# Patient Record
Sex: Female | Born: 1960 | Race: White | Hispanic: No | Smoking: Former smoker
Health system: Southern US, Community
[De-identification: ages and names within clinical notes are randomized; demographics above are authoritative.]

## PROBLEM LIST (undated history)

## (undated) DIAGNOSIS — Z8042 Family history of malignant neoplasm of prostate: Secondary | ICD-10-CM

## (undated) DIAGNOSIS — E0500 Thyrotoxicosis with diffuse goiter without thyrotoxic crisis or storm: Secondary | ICD-10-CM

## (undated) DIAGNOSIS — E0590 Thyrotoxicosis, unspecified without thyrotoxic crisis or storm: Secondary | ICD-10-CM

## (undated) DIAGNOSIS — R3129 Other microscopic hematuria: Secondary | ICD-10-CM

## (undated) DIAGNOSIS — E039 Hypothyroidism, unspecified: Secondary | ICD-10-CM

## (undated) DIAGNOSIS — Z803 Family history of malignant neoplasm of breast: Secondary | ICD-10-CM

## (undated) DIAGNOSIS — D126 Benign neoplasm of colon, unspecified: Secondary | ICD-10-CM

## (undated) DIAGNOSIS — Z853 Personal history of malignant neoplasm of breast: Secondary | ICD-10-CM

## (undated) DIAGNOSIS — J309 Allergic rhinitis, unspecified: Secondary | ICD-10-CM

## (undated) DIAGNOSIS — C801 Malignant (primary) neoplasm, unspecified: Secondary | ICD-10-CM

## (undated) DIAGNOSIS — R946 Abnormal results of thyroid function studies: Secondary | ICD-10-CM

## (undated) DIAGNOSIS — R609 Edema, unspecified: Secondary | ICD-10-CM

## (undated) DIAGNOSIS — M1990 Unspecified osteoarthritis, unspecified site: Secondary | ICD-10-CM

## (undated) HISTORY — DX: Family history of malignant neoplasm of breast: Z80.3

## (undated) HISTORY — DX: Malignant (primary) neoplasm, unspecified: C80.1

## (undated) HISTORY — DX: Personal history of malignant neoplasm of breast: Z85.3

## (undated) HISTORY — DX: Other microscopic hematuria: R31.29

## (undated) HISTORY — DX: Abnormal results of thyroid function studies: R94.6

## (undated) HISTORY — PX: CHOLECYSTECTOMY: SHX55

## (undated) HISTORY — DX: Thyrotoxicosis, unspecified without thyrotoxic crisis or storm: E05.90

## (undated) HISTORY — DX: Edema, unspecified: R60.9

## (undated) HISTORY — DX: Benign neoplasm of colon, unspecified: D12.6

## (undated) HISTORY — PX: BREAST SURGERY: SHX581

## (undated) HISTORY — DX: Family history of malignant neoplasm of prostate: Z80.42

## (undated) HISTORY — DX: Allergic rhinitis, unspecified: J30.9

---

## 1994-07-24 DIAGNOSIS — C801 Malignant (primary) neoplasm, unspecified: Secondary | ICD-10-CM

## 1994-07-24 HISTORY — DX: Malignant (primary) neoplasm, unspecified: C80.1

## 1998-05-11 ENCOUNTER — Encounter: Admission: RE | Admit: 1998-05-11 | Discharge: 1998-08-09 | Payer: Self-pay

## 1998-08-26 ENCOUNTER — Other Ambulatory Visit: Admission: RE | Admit: 1998-08-26 | Discharge: 1998-08-26 | Payer: Self-pay | Admitting: Obstetrics and Gynecology

## 1998-11-25 ENCOUNTER — Ambulatory Visit (HOSPITAL_COMMUNITY): Admission: RE | Admit: 1998-11-25 | Discharge: 1998-11-25 | Payer: Self-pay | Admitting: Surgery

## 1998-11-25 ENCOUNTER — Encounter: Payer: Self-pay | Admitting: Surgery

## 1999-12-30 ENCOUNTER — Other Ambulatory Visit: Admission: RE | Admit: 1999-12-30 | Discharge: 1999-12-30 | Payer: Self-pay | Admitting: Obstetrics and Gynecology

## 2001-01-16 ENCOUNTER — Other Ambulatory Visit: Admission: RE | Admit: 2001-01-16 | Discharge: 2001-01-16 | Payer: Self-pay | Admitting: Obstetrics and Gynecology

## 2001-07-19 ENCOUNTER — Other Ambulatory Visit: Admission: RE | Admit: 2001-07-19 | Discharge: 2001-07-19 | Payer: Self-pay | Admitting: Obstetrics and Gynecology

## 2001-11-18 ENCOUNTER — Encounter: Payer: Self-pay | Admitting: Specialist

## 2001-11-18 ENCOUNTER — Ambulatory Visit (HOSPITAL_COMMUNITY): Admission: RE | Admit: 2001-11-18 | Discharge: 2001-11-18 | Payer: Self-pay | Admitting: Specialist

## 2003-01-07 ENCOUNTER — Other Ambulatory Visit: Admission: RE | Admit: 2003-01-07 | Discharge: 2003-01-07 | Payer: Self-pay | Admitting: Obstetrics and Gynecology

## 2003-05-07 ENCOUNTER — Encounter: Payer: Self-pay | Admitting: Gastroenterology

## 2003-05-07 ENCOUNTER — Encounter: Admission: RE | Admit: 2003-05-07 | Discharge: 2003-05-07 | Payer: Self-pay | Admitting: Gastroenterology

## 2003-05-25 ENCOUNTER — Encounter (INDEPENDENT_AMBULATORY_CARE_PROVIDER_SITE_OTHER): Payer: Self-pay | Admitting: Specialist

## 2003-05-25 ENCOUNTER — Ambulatory Visit (HOSPITAL_COMMUNITY): Admission: RE | Admit: 2003-05-25 | Discharge: 2003-05-25 | Payer: Self-pay | Admitting: Gastroenterology

## 2004-01-27 ENCOUNTER — Other Ambulatory Visit: Admission: RE | Admit: 2004-01-27 | Discharge: 2004-01-27 | Payer: Self-pay | Admitting: Obstetrics and Gynecology

## 2005-06-08 ENCOUNTER — Other Ambulatory Visit: Admission: RE | Admit: 2005-06-08 | Discharge: 2005-06-08 | Payer: Self-pay | Admitting: Obstetrics and Gynecology

## 2008-06-14 LAB — HM COLONOSCOPY

## 2008-09-21 LAB — CONVERTED CEMR LAB: Pap Smear: NORMAL

## 2009-09-14 LAB — HM MAMMOGRAPHY: HM Mammogram: NEGATIVE

## 2009-10-08 ENCOUNTER — Ambulatory Visit: Payer: Self-pay | Admitting: Family Medicine

## 2009-10-08 DIAGNOSIS — R609 Edema, unspecified: Secondary | ICD-10-CM | POA: Insufficient documentation

## 2009-10-08 DIAGNOSIS — J309 Allergic rhinitis, unspecified: Secondary | ICD-10-CM

## 2009-10-08 DIAGNOSIS — Z853 Personal history of malignant neoplasm of breast: Secondary | ICD-10-CM

## 2009-10-08 DIAGNOSIS — D126 Benign neoplasm of colon, unspecified: Secondary | ICD-10-CM | POA: Insufficient documentation

## 2009-10-08 HISTORY — DX: Benign neoplasm of colon, unspecified: D12.6

## 2009-10-08 HISTORY — DX: Allergic rhinitis, unspecified: J30.9

## 2009-10-08 HISTORY — DX: Edema, unspecified: R60.9

## 2009-10-08 HISTORY — DX: Personal history of malignant neoplasm of breast: Z85.3

## 2009-10-25 ENCOUNTER — Ambulatory Visit: Payer: Self-pay | Admitting: Family Medicine

## 2009-10-25 LAB — CONVERTED CEMR LAB
ALT: 30 units/L (ref 0–35)
AST: 28 units/L (ref 0–37)
Albumin: 3.3 g/dL — ABNORMAL LOW (ref 3.5–5.2)
Alkaline Phosphatase: 37 units/L — ABNORMAL LOW (ref 39–117)
BUN: 11 mg/dL (ref 6–23)
Basophils Absolute: 0 10*3/uL (ref 0.0–0.1)
Basophils Relative: 0.5 % (ref 0.0–3.0)
Bilirubin Urine: NEGATIVE
Bilirubin, Direct: 0.1 mg/dL (ref 0.0–0.3)
CO2: 26 meq/L (ref 19–32)
Calcium: 9 mg/dL (ref 8.4–10.5)
Chloride: 109 meq/L (ref 96–112)
Cholesterol: 100 mg/dL (ref 0–200)
Creatinine, Ser: 0.5 mg/dL (ref 0.4–1.2)
Eosinophils Absolute: 0.1 10*3/uL (ref 0.0–0.7)
Eosinophils Relative: 2.7 % (ref 0.0–5.0)
GFR calc non Af Amer: 140.41 mL/min (ref >60)
Glucose, Bld: 87 mg/dL (ref 70–99)
Glucose, Urine, Semiquant: NEGATIVE
HCT: 35.8 % — ABNORMAL LOW (ref 36.0–46.0)
HDL: 39.2 mg/dL (ref >39.00)
Hemoglobin: 12.5 g/dL (ref 12.0–15.0)
Ketones, urine, test strip: NEGATIVE
LDL Cholesterol: 51 mg/dL (ref 0–99)
Lymphocytes Relative: 45 % (ref 12.0–46.0)
Lymphs Abs: 2.1 10*3/uL (ref 0.7–4.0)
MCHC: 34.9 g/dL (ref 30.0–36.0)
MCV: 91.8 fL (ref 78.0–100.0)
Monocytes Absolute: 0.4 10*3/uL (ref 0.1–1.0)
Monocytes Relative: 7.7 % (ref 3.0–12.0)
Neutro Abs: 2.1 10*3/uL (ref 1.4–7.7)
Neutrophils Relative %: 44.1 % (ref 43.0–77.0)
Nitrite: NEGATIVE
Platelets: 203 10*3/uL (ref 150.0–400.0)
Potassium: 3.9 meq/L (ref 3.5–5.1)
Protein, U semiquant: NEGATIVE
RBC: 3.9 M/uL (ref 3.87–5.11)
RDW: 11.8 % (ref 11.5–14.6)
Sodium: 143 meq/L (ref 135–145)
Specific Gravity, Urine: 1.015
TSH: 0.01 microintl units/mL — ABNORMAL LOW (ref 0.35–5.50)
Total Bilirubin: 1 mg/dL (ref 0.3–1.2)
Total CHOL/HDL Ratio: 3
Total Protein: 5.6 g/dL — ABNORMAL LOW (ref 6.0–8.3)
Triglycerides: 51 mg/dL (ref 0.0–149.0)
Urobilinogen, UA: 0.2
VLDL: 10.2 mg/dL (ref 0.0–40.0)
WBC: 4.7 10*3/uL (ref 4.5–10.5)
pH: 5

## 2009-11-01 ENCOUNTER — Ambulatory Visit: Payer: Self-pay | Admitting: Family Medicine

## 2009-11-01 DIAGNOSIS — R3129 Other microscopic hematuria: Secondary | ICD-10-CM

## 2009-11-01 DIAGNOSIS — R946 Abnormal results of thyroid function studies: Secondary | ICD-10-CM | POA: Insufficient documentation

## 2009-11-01 HISTORY — DX: Other microscopic hematuria: R31.29

## 2009-11-01 HISTORY — DX: Abnormal results of thyroid function studies: R94.6

## 2009-11-01 LAB — CONVERTED CEMR LAB
Bilirubin Urine: NEGATIVE
Glucose, Urine, Semiquant: NEGATIVE
Ketones, urine, test strip: NEGATIVE
Nitrite: NEGATIVE
Protein, U semiquant: NEGATIVE
Specific Gravity, Urine: <1.005
Urobilinogen, UA: 0.2
pH: 7.5

## 2009-11-03 DIAGNOSIS — E0590 Thyrotoxicosis, unspecified without thyrotoxic crisis or storm: Secondary | ICD-10-CM

## 2009-11-03 HISTORY — DX: Thyrotoxicosis, unspecified without thyrotoxic crisis or storm: E05.90

## 2009-11-03 LAB — CONVERTED CEMR LAB
Free T4: 3.2 ng/dL — ABNORMAL HIGH (ref 0.6–1.6)
TSH: 0.01 microintl units/mL — ABNORMAL LOW (ref 0.35–5.50)

## 2009-11-08 ENCOUNTER — Telehealth: Payer: Self-pay | Admitting: Family Medicine

## 2009-11-15 ENCOUNTER — Encounter (HOSPITAL_COMMUNITY): Admission: RE | Admit: 2009-11-15 | Discharge: 2010-02-13 | Payer: Self-pay | Admitting: Family Medicine

## 2009-11-30 ENCOUNTER — Encounter: Payer: Self-pay | Admitting: Family Medicine

## 2009-12-29 ENCOUNTER — Telehealth: Payer: Self-pay | Admitting: Family Medicine

## 2010-01-25 ENCOUNTER — Encounter (HOSPITAL_COMMUNITY): Admission: RE | Admit: 2010-01-25 | Discharge: 2010-03-30 | Payer: Self-pay | Admitting: Internal Medicine

## 2010-08-14 ENCOUNTER — Encounter: Payer: Self-pay | Admitting: Family Medicine

## 2010-08-24 HISTORY — PX: OTHER SURGICAL HISTORY: SHX169

## 2010-08-25 NOTE — Progress Notes (Signed)
Summary: heart rate 105 & very nervous  Phone Note Call from Patient Call back at [phone]   Summary of Call: Heart rate has gone up to 105, normal 68-72.  Very nervous & hard to concentrate.   Want nuclear med thyroid scan at [facility] moved up if possble.  Scheduled Apr 26 at 12:00 Initial call taken by: Rudy Jew, RN,  November 08, 2009 12:37 PM  Follow-up for Phone Call        Start Toprol XL 25 mg by mouth once daily (disp #30 with 3 refills) and see if we can move up  her nuclear thyroid scan.  She requests that her Rx be sent electronically to her pharmacy. Follow-up by: Evelena Peat MD,  November 08, 2009 1:21 PM  Additional Follow-up for Phone Call Additional follow up Details #1::        Phone Call Completed Additional Follow-up by: Rudy Jew, RN,  November 08, 2009 1:30 PM    New/Updated Medications: TOPROL XL 25 MG XR24H-TAB (METOPROLOL SUCCINATE) by mouth once daily Prescriptions: TOPROL XL 25 MG XR24H-TAB (METOPROLOL SUCCINATE) by mouth once daily  #30 x 3   Entered by:   Rudy Jew, RN   Authorized by:   Evelena Peat MD   Signed by:   Rudy Jew, RN on 11/08/2009   Method used:   [facility] to        [facility] [phone]* (retail)       [address]       [place], [place]  [address]       Ph: [phone] or [phone]       Fax: [phone]   RxID:   [id number]

## 2010-08-25 NOTE — Assessment & Plan Note (Signed)
Summary: est //ccm/pt rsc/cjr   Vital Signs:  Patient profile:   50 year old female Menstrual status:  regular LMP:     09/27/2009 Height:      67 inches Weight:      170 pounds BMI:     26.72 Temp:     99.3 degrees F oral Pulse rate:   88 / minute Pulse rhythm:   regular Resp:     12 per minute BP sitting:   110 / 64  (left arm) Cuff size:   regular  Vitals Entered By: Sid Falcon LPN (October 08, 2009 10:21 AM) CC: New to establish, swelling in ankles LMP (date): 09/27/2009     Menstrual Status regular Enter LMP: 09/27/2009 Last PAP Result normal   History of Present Illness: New patient to establish care.  Past history of breast cancer back in 1996. Right breast lumpectomy with radiation therapy. Previous cholecystectomy 1993. Allergy to sulfa. Takes Klonopin as needed for anxiety. History of benign colon polyps.  Family history significant for alcoholism Brother, sister, and father. Father with hypertension history.  Patient is divorced. Works full-time. One daughter. Smokes one half to one pack cigarettes per day. Rare alcohol use.  Colonoscopy 2010. Last tetanus greater than 10 years ago. Regular GYN followup and gets yearly mammograms.  Acute issue is increased ankle edema past several weeks. Occasional pitting. Worse late in the day. No dyspnea.  No regular NSAID use.  Patient has chronic sinus congestion and postnasal drip symptoms. Previously on Nasonex which worked well. Currently not using antihistamine. Alavert worked minimally in the past.  Press photographer & Management  Alcohol-Tobacco     Smoking Status: current     Smoking Cessation Counseling: yes     Smoke Cessation Stage: ready     Packs/Day: 1.0     Year Started: 1984  Caffeine-Diet-Exercise     Does Patient Exercise: no  Allergies (verified): 1)  Codeine Sulfate (Codeine Sulfate)  Past History:  Family History: Last updated: 10/08/2009 Family History of  Alcoholism/Addiction, sister, brother Family History of Arthritis, mother Family History Lung cancer Father, alcohol, drug problem, hypertension, PTSD  Social History: Last updated: 10/08/2009 Occupation:  RN Divorced Current Smoker Alcohol use-yes Regular exercise-no  Risk Factors: Exercise: no (10/08/2009)  Risk Factors: Smoking Status: current (10/08/2009) Packs/Day: 1.0 (10/08/2009)  Past Medical History: Breast cancer Right breast 1996 Colonic polyps  Past Surgical History: Cholecystectomy 1993 BRCA Right lumpectomy with axillary node disection 6 weeks radiation R breast  Family History: Family History of Alcoholism/Addiction, sister, brother Family History of Arthritis, mother Family History Lung cancer Father, alcohol, drug problem, hypertension, PTSD  Social History: Occupation:  Charity fundraiser Divorced Current Smoker Alcohol use-yes Regular exercise-no Smoking Status:  current Packs/Day:  1.0 Occupation:  employed Does Patient Exercise:  no  Review of Systems       The patient complains of peripheral edema.  The patient denies anorexia, fever, weight loss, weight gain, chest pain, syncope, dyspnea on exertion, prolonged cough, headaches, hemoptysis, abdominal pain, melena, hematochezia, severe indigestion/heartburn, muscle weakness, and depression.         frequent palpitations at rest. No syncope or presyncopal symptoms  Physical Exam  General:  Well-developed,well-nourished,in no acute distress; alert,appropriate and cooperative throughout examination Eyes:  pupils equal, pupils round, and pupils reactive to light.   Ears:  scarring left TM and no acute change Mouth:  Oral mucosa and oropharynx without lesions or exudates.  Teeth in good repair. Neck:  No deformities, masses, or  tenderness noted. Lungs:  Normal respiratory effort, chest expands symmetrically. Lungs are clear to auscultation, no crackles or wheezes. Heart:  normal rate, regular rhythm, and no  murmur.   Extremities:  no pitting edema at this time. 2 plus dorsalis pedis pulses bilaterally Neurologic:  alert & oriented X3.   Psych:  good eye contact and not depressed appearing.     Impression & Recommendations:  Problem # 1:  EDEMA (ICD-782.3) Assessment New pt wil schedule CPE and get screening labs then.  Problem # 2:  ALLERGIC RHINITIS (ICD-477.9) Assessment: Deteriorated get back on Nasonex Her updated medication list for this problem includes:    Nasonex 50 Mcg/act Susp (Mometasone furoate) .Marland Kitchen... 2 sprays per nostril once daily  Problem # 3:  NEOPLASM, MALIGNANT, BREAST, HX OF (ICD-V10.3) Assessment: Unchanged  Problem # 4:  Preventive Health Care (ICD-V70.0) patient encouraged to schedule complete physical examination.  pt needs tetanus booster and this is given today.  Problem # 5:  COLONIC POLYPS (ICD-211.3) Assessment: Comment Only  Complete Medication List: 1)  Nasonex 50 Mcg/act Susp (Mometasone furoate) .... 2 sprays per nostril once daily  Other Orders: Tdap => 77yrs IM ([address]) Admin 1st Vaccine (60454)  Patient Instructions: 1)  Schedule complete physical examination 2)  Stop smoking tips: Choose a quit date. Cut down before the quit date. Decide what you will do as a substitute when you feel the urge to smoke(gum, toothpick, exercise).  3)  It is important that you exercise reguarly at least 20 minutes 5 times a week. If you develop chest pain, have severe difficulty breathing, or feel very tired, stop exercising immediately and seek medical attention.  Prescriptions: NASONEX 50 MCG/ACT SUSP (MOMETASONE FUROATE) 2 sprays per nostril once daily  #1 x 11   Entered and Authorized by:   Evelena Peat MD   Signed by:   Evelena Peat MD on 10/08/2009   Method used:   [facility] to        [facility] [phone][facility] (retail)       [address]       [place], [place]  [address]       Ph: [phone] or [phone]       Fax: [phone]   RxID:    [id number]   Preventive Care Screening  Mammogram:    Date:  08/24/2009    Results:  normal   Pap Smear:    Date:  09/21/2008    Results:  normal     Immunizations Administered:  Tetanus Vaccine:    Vaccine Type: Tdap    Site: left deltoid    Mfr: GlaxoSmithKline    Dose: 0.5 ml    Route: IM    Given by: Sid Falcon LPN    Exp. Date: 10/15/2011    Lot #: [id number]

## 2010-08-25 NOTE — Assessment & Plan Note (Signed)
Summary: cpx/no pap./njr   Vital Signs:  Patient profile:   50 year old female Menstrual status:  regular Height:      67 inches Weight:      168 pounds Temp:     98.7 degrees F oral Pulse rate:   80 / minute Pulse rhythm:   regular Resp:     12 per minute BP sitting:   100 / 60  (right arm) Cuff size:   regular  Vitals Entered By: Sid Falcon LPN (November 01, 2009 10:28 AM) CC: CPX, labs done   History of Present Illness: Patient for complete physical examination. Refer to previous notes for details regarding family history, past medical history, and social history.  Patient gets her gynecologic checkups locally. Colonoscopy and tetanus up to date. She plans to quit smoking soon. No regular exercise.  Allergies: 1)  Ferrous Sulfate (Ferrous Sulfate)  Past History:  Past Medical History: Last updated: 10/08/2009 Breast cancer Right breast 1996 Colonic polyps  Past Surgical History: Last updated: 10/08/2009 Cholecystectomy 1993 BRCA Right lumpectomy with axillary node disection 6 weeks radiation R breast  Family History: Last updated: 10/08/2009 Family History of Alcoholism/Addiction, sister, brother Family History of Arthritis, mother Family History Lung cancer Father, alcohol, drug problem, hypertension, PTSD  Social History: Last updated: 10/08/2009 Occupation:  RN Divorced Current Smoker Alcohol use-yes Regular exercise-no  Risk Factors: Exercise: no (10/08/2009)  Risk Factors: Smoking Status: current (10/08/2009) Packs/Day: 1.0 (10/08/2009)  Review of Systems  The patient denies anorexia, fever, weight loss, weight gain, vision loss, decreased hearing, hoarseness, chest pain, syncope, dyspnea on exertion, peripheral edema, prolonged cough, headaches, hemoptysis, abdominal pain, melena, hematochezia, severe indigestion/heartburn, hematuria, incontinence, genital sores, muscle weakness, suspicious skin lesions, transient blindness, difficulty  walking, depression, unusual weight change, abnormal bleeding, enlarged lymph nodes, and breast masses.         patient has occ palpitations. No dizziness or chest pains.  Physical Exam  General:  Well-developed,well-nourished,in no acute distress; alert,appropriate and cooperative throughout examination Head:  Normocephalic and atraumatic without obvious abnormalities. No apparent alopecia or balding. Eyes:  No corneal or conjunctival inflammation noted. EOMI. Perrla. Funduscopic exam benign, without hemorrhages, exudates or papilledema. Vision grossly normal. Ears:  External ear exam shows no significant lesions or deformities.  Otoscopic examination reveals clear canals, tympanic membranes are intact bilaterally without bulging, retraction, inflammation or discharge. Hearing is grossly normal bilaterally. Mouth:  Oral mucosa and oropharynx without lesions or exudates.  Teeth in good repair. Neck:  No deformities, masses, or tenderness noted. Breasts:  per GYN Lungs:  Normal respiratory effort, chest expands symmetrically. Lungs are clear to auscultation, no crackles or wheezes. Heart:  Normal rate and regular rhythm. S1 and S2 normal without gallop, murmur, click, rub or other extra sounds. Abdomen:  Bowel sounds positive,abdomen soft and non-tender without masses, organomegaly or hernias noted. Genitalia:  GYN Msk:  No deformity or scoliosis noted of thoracic or lumbar spine.   Extremities:  No clubbing, cyanosis, edema, or deformity noted with normal full range of motion of all joints.   Neurologic:  alert & oriented X3, cranial nerves II-XII intact, and strength normal in all extremities.   Skin:  Intact without suspicious lesions or rashes Cervical Nodes:  No lymphadenopathy noted Psych:  normally interactive, good eye contact, not anxious appearing, and not depressed appearing.     Impression & Recommendations:  Problem # 1:  Preventive Health Care (ICD-V70.0) smoking cessation  discussed. Regular exercise. Labs reviewed. Significant for low TSH.  No overt symptoms of hyperthyroidism. Repeat TSH low T4 and T3  Problem # 2:  THYROID FUNCTION TEST, ABNORMAL (ICD-794.5) low TSH by labs.  other than palpitations no other sxs of hyperthyroid-no weight loss, diarrhea, tremor,etc  Will repeat TSH and add T4. Orders: TLB-TSH (Thyroid Stimulating Hormone) (84443-TSH) TLB-T4 (Thyrox), Free [id number]) Venipuncture (33295)  Problem # 3:  MICROSCOPIC HEMATURIA (ICD-599.72) hematuria 2 + on recent UA and today only trace.   Repeat again in 1-2 weeks to make sure this has cleared. Orders: UA Dipstick w/o Micro (manual) (18841) Venipuncture (66063)  Complete Medication List: 1)  Nasonex 50 Mcg/act Susp (Mometasone furoate) .... 2 sprays per nostril once daily 2)  Klonopin 1 Mg Tabs (Clonazepam) .... 1/2 as needed  Patient Instructions: 1)  Stop smoking tips: Choose a quit date. Cut down before the quit date. Decide what you will do as a substitute when you feel the urge to smoke(gum, toothpick, exercise).  2)  It is important that you exercise reguarly at least 20 minutes 5 times a week. If you develop chest pain, have severe difficulty breathing, or feel very tired, stop exercising immediately and seek medical attention.   Preventive Care Screening  Colonoscopy:    Date:  05/24/2008    Results:  Adenomatous Polyp   Laboratory Results   Urine Tests    Routine Urinalysis   Color: yellow Appearance: Clear Glucose: negative   (Normal Range: Negative) Bilirubin: negative   (Normal Range: Negative) Ketone: negative   (Normal Range: Negative) Spec. Gravity: <1.005   (Normal Range: 1.003-1.035) Blood: trace-intact   (Normal Range: Negative) pH: 7.5   (Normal Range: 5.0-8.0) Protein: negative   (Normal Range: Negative) Urobilinogen: 0.2   (Normal Range: 0-1) Nitrite: negative   (Normal Range: Negative) Leukocyte Esterace: moderate   (Normal Range: Negative)      Comments: Sid Falcon LPN  November 01, 2009 12:10 PM

## 2010-08-25 NOTE — Consult Note (Signed)
[facility]   Imported By: Alicia Mcconnell 12/08/2009 09:00:23  _____________________________________________________________________  External Attachment:    Type:   Image     Comment:   External Document

## 2010-08-25 NOTE — Progress Notes (Signed)
Summary: REFILL REQUEST Metoprolol  Phone Note Refill Request Message from:  Patient on December 29, 2009 8:33 AM  Refills Requested: Medication #1:  TOPROL XL 25 MG XR24H-TAB by mouth once daily.   Notes: 90[facility]DAY SUPPLY... [facility] [place]    Initial call taken by: Debbra Riding,  December 29, 2009 8:34 AM    Prescriptions: TOPROL XL 25 MG XR24H-TAB (METOPROLOL SUCCINATE) by mouth once daily  #90 x 3   Entered by:   Sid Falcon LPN   Authorized by:   Evelena Peat MD   Signed by:   Sid Falcon LPN on 62/95/2841   Method used:   [facility]ron[facility] to        [facility] [phone]* (retail)       [address]       [place], [place]  [address]       Ph: [phone] or [phone]       Fax: [phone]   RxID:   [id number]

## 2010-09-02 ENCOUNTER — Telehealth: Payer: Self-pay | Admitting: Family Medicine

## 2010-09-02 NOTE — Telephone Encounter (Signed)
Would like a call back. 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

## 2010-09-02 NOTE — Telephone Encounter (Signed)
Labs mailed to pt home as requested

## 2010-09-21 ENCOUNTER — Ambulatory Visit: Payer: BC Managed Care – PPO | Attending: Orthopaedic Surgery | Admitting: Physical Therapy

## 2010-09-21 DIAGNOSIS — M25519 Pain in unspecified shoulder: Secondary | ICD-10-CM | POA: Insufficient documentation

## 2010-09-21 DIAGNOSIS — M25619 Stiffness of unspecified shoulder, not elsewhere classified: Secondary | ICD-10-CM | POA: Insufficient documentation

## 2010-09-21 DIAGNOSIS — IMO0001 Reserved for inherently not codable concepts without codable children: Secondary | ICD-10-CM | POA: Insufficient documentation

## 2010-09-23 ENCOUNTER — Ambulatory Visit: Payer: BC Managed Care – PPO | Attending: Orthopaedic Surgery | Admitting: Physical Therapy

## 2010-09-23 DIAGNOSIS — M25519 Pain in unspecified shoulder: Secondary | ICD-10-CM | POA: Insufficient documentation

## 2010-09-23 DIAGNOSIS — M25619 Stiffness of unspecified shoulder, not elsewhere classified: Secondary | ICD-10-CM | POA: Insufficient documentation

## 2010-09-23 DIAGNOSIS — IMO0001 Reserved for inherently not codable concepts without codable children: Secondary | ICD-10-CM | POA: Insufficient documentation

## 2010-09-26 ENCOUNTER — Ambulatory Visit: Payer: BC Managed Care – PPO | Admitting: Physical Therapy

## 2010-09-28 ENCOUNTER — Ambulatory Visit: Payer: BC Managed Care – PPO | Admitting: Physical Therapy

## 2010-09-29 ENCOUNTER — Ambulatory Visit: Payer: BC Managed Care – PPO | Admitting: Physical Therapy

## 2010-09-30 ENCOUNTER — Ambulatory Visit: Payer: BC Managed Care – PPO | Admitting: Physical Therapy

## 2010-10-03 ENCOUNTER — Ambulatory Visit: Payer: BC Managed Care – PPO | Admitting: Physical Therapy

## 2010-10-05 ENCOUNTER — Ambulatory Visit: Payer: BC Managed Care – PPO | Admitting: Physical Therapy

## 2010-10-07 ENCOUNTER — Ambulatory Visit: Payer: BC Managed Care – PPO | Admitting: Physical Therapy

## 2010-10-09 LAB — HCG, SERUM, QUALITATIVE: Preg, Serum: NEGATIVE

## 2010-10-11 ENCOUNTER — Ambulatory Visit: Payer: BC Managed Care – PPO | Admitting: Physical Therapy

## 2010-10-13 ENCOUNTER — Ambulatory Visit: Payer: BC Managed Care – PPO | Admitting: Physical Therapy

## 2010-10-17 ENCOUNTER — Ambulatory Visit: Payer: BC Managed Care – PPO | Admitting: Physical Therapy

## 2010-10-18 ENCOUNTER — Other Ambulatory Visit: Payer: Self-pay | Admitting: Obstetrics and Gynecology

## 2010-10-19 ENCOUNTER — Ambulatory Visit: Payer: BC Managed Care – PPO | Admitting: Physical Therapy

## 2010-10-21 ENCOUNTER — Encounter: Payer: BC Managed Care – PPO | Admitting: Physical Therapy

## 2010-10-21 ENCOUNTER — Ambulatory Visit: Payer: BC Managed Care – PPO | Admitting: Physical Therapy

## 2010-10-24 ENCOUNTER — Ambulatory Visit: Payer: BC Managed Care – PPO | Attending: Family Medicine | Admitting: Physical Therapy

## 2010-10-24 DIAGNOSIS — M25519 Pain in unspecified shoulder: Secondary | ICD-10-CM | POA: Insufficient documentation

## 2010-10-24 DIAGNOSIS — M25619 Stiffness of unspecified shoulder, not elsewhere classified: Secondary | ICD-10-CM | POA: Insufficient documentation

## 2010-10-24 DIAGNOSIS — IMO0001 Reserved for inherently not codable concepts without codable children: Secondary | ICD-10-CM | POA: Insufficient documentation

## 2010-10-26 ENCOUNTER — Ambulatory Visit: Payer: BC Managed Care – PPO | Admitting: Physical Therapy

## 2010-10-27 ENCOUNTER — Ambulatory Visit: Payer: BC Managed Care – PPO | Admitting: Physical Therapy

## 2010-11-01 ENCOUNTER — Ambulatory Visit: Payer: BC Managed Care – PPO | Admitting: Physical Therapy

## 2010-11-03 ENCOUNTER — Ambulatory Visit: Payer: BC Managed Care – PPO | Admitting: Physical Therapy

## 2010-11-07 ENCOUNTER — Ambulatory Visit: Payer: BC Managed Care – PPO | Admitting: Physical Therapy

## 2010-11-10 ENCOUNTER — Ambulatory Visit: Payer: BC Managed Care – PPO | Admitting: Physical Therapy

## 2010-11-14 ENCOUNTER — Ambulatory Visit: Payer: BC Managed Care – PPO | Admitting: Physical Therapy

## 2010-11-16 ENCOUNTER — Ambulatory Visit: Payer: BC Managed Care – PPO | Admitting: Physical Therapy

## 2010-11-21 ENCOUNTER — Ambulatory Visit: Payer: BC Managed Care – PPO | Admitting: Physical Therapy

## 2010-11-24 ENCOUNTER — Ambulatory Visit: Payer: BC Managed Care – PPO | Attending: Orthopaedic Surgery | Admitting: Physical Therapy

## 2010-11-24 DIAGNOSIS — M25519 Pain in unspecified shoulder: Secondary | ICD-10-CM | POA: Insufficient documentation

## 2010-11-24 DIAGNOSIS — M25619 Stiffness of unspecified shoulder, not elsewhere classified: Secondary | ICD-10-CM | POA: Insufficient documentation

## 2010-11-24 DIAGNOSIS — IMO0001 Reserved for inherently not codable concepts without codable children: Secondary | ICD-10-CM | POA: Insufficient documentation

## 2010-11-30 ENCOUNTER — Ambulatory Visit: Payer: BC Managed Care – PPO | Admitting: Physical Therapy

## 2010-12-09 NOTE — Op Note (Signed)
NAMEMarland Kitchen  VENIA, Alicia                      ACCOUNT NO.:  [id number]   MEDICAL RECORD NO.:  [id number]                   PATIENT TYPE:  AMB   LOCATION:  ENDO                                 FACILITY:  [facility]   PHYSICIAN:  Anselmo Rod, M.D.               DATE OF BIRTH:  11-13-1960   DATE OF PROCEDURE:  05/25/2003  DATE OF DISCHARGE:                                 OPERATIVE REPORT   PROCEDURE PERFORMED:  Colonoscopy with snare polypectomy x1 and cold biopsy  x1.   ENDOSCOPIST:  Anselmo Rod, M.D.   INSTRUMENT USED:  Olympus videocolonoscope.   INDICATION FOR THE PROCEDURE:  A 50 year old white female with a personal  history of breast cancer undergoing screening colonoscopy, rule out colonic  polyps, masses, etc.  The patient has had a history of rectal bleeding in  the past.   PREPROCEDURE PREPARATION:  Informed consent was procured from the patient.  The patient was fasted for eight hours prior to the procedure and prepped  with a bottle of magnesium citrate and a gallon of GoLYTELY the night prior  to the procedure.   PREPROCEDURE PHYSICAL:  VITAL SIGNS:  Stable.  NECK:  Supple.  CHEST:  Clear to auscultation.  S1 and S2 regular.  ABDOMEN:  Soft with normal bowel sounds.   DESCRIPTION OF THE PROCEDURE:  The patient was placed in the left lateral  decubitus position and sedated with 70 mg of Demerol and 7 mg of Versed  intravenously.  Once the patient was adequately sedated and maintained on  low-flow oxygen and continuous cardiac monitoring, the Olympus  videocolonoscope was advanced from the rectum to the cecum with difficulty.  There was some residual stools especially on the right side of the colon.  A  polyp was snared from the cecal base.  Another polyp was biopsied from this  area.  There was some bleeding and noticed freely after the biopsy was done.  The patient claimed she was taking Lodine and did not stop the Lodine five  day prior to the procedure.   Retroflexion in the rectum revealed small  internal hemorrhoids.  No other abnormalities were noted.  The patient  tolerated the procedure well without complication.   IMPRESSION:  1. Small nonbleeding internal hemorrhoids.  2. Small polyp snared from the cecal base.  Another polyp biopsied from the     cecal base.  3. Large amount of residual stool in the colon.  Small lesions could have     been missed.   RECOMMENDATIONS:  1. Await pathology results.  2.     No Lodine or nonsteroidals for the next 3-4 weeks.  3. Outpatient followup in the next two weeks.  4. Repeat colorectal cancer screening depending on the pathology results.  Anselmo Rod, M.D.    JNM/MEDQ  D:  05/25/2003  T:  05/25/2003  Job:  [id number]   cc:   Evelena Peat, M.D.  [address]  [place]  [place] [address]  Fax: [phone]

## 2011-05-05 ENCOUNTER — Other Ambulatory Visit (INDEPENDENT_AMBULATORY_CARE_PROVIDER_SITE_OTHER): Payer: BC Managed Care – PPO

## 2011-05-05 DIAGNOSIS — Z0000 Encounter for general adult medical examination without abnormal findings: Secondary | ICD-10-CM

## 2011-05-05 LAB — CBC WITH DIFFERENTIAL/PLATELET
Basophils Absolute: 0 10*3/uL (ref 0.0–0.1)
Basophils Relative: 0.9 % (ref 0.0–3.0)
Eosinophils Absolute: 0.2 10*3/uL (ref 0.0–0.7)
Eosinophils Relative: 3.2 % (ref 0.0–5.0)
HCT: 41.3 % (ref 36.0–46.0)
Hemoglobin: 13.9 g/dL (ref 12.0–15.0)
Lymphocytes Relative: 32.1 % (ref 12.0–46.0)
Lymphs Abs: 1.6 10*3/uL (ref 0.7–4.0)
MCHC: 33.8 g/dL (ref 30.0–36.0)
MCV: 99.2 fl (ref 78.0–100.0)
Monocytes Absolute: 0.3 10*3/uL (ref 0.1–1.0)
Monocytes Relative: 6.5 % (ref 3.0–12.0)
Neutro Abs: 2.9 10*3/uL (ref 1.4–7.7)
Neutrophils Relative %: 57.3 % (ref 43.0–77.0)
Platelets: 224 10*3/uL (ref 150.0–400.0)
RBC: 4.16 Mil/uL (ref 3.87–5.11)
RDW: 12.4 % (ref 11.5–14.6)
WBC: 5.1 10*3/uL (ref 4.5–10.5)

## 2011-05-05 LAB — POCT URINALYSIS DIPSTICK
Bilirubin, UA: NEGATIVE
Blood, UA: NEGATIVE
Glucose, UA: NEGATIVE
Ketones, UA: NEGATIVE
Leukocytes, UA: NEGATIVE
Nitrite, UA: NEGATIVE
Protein, UA: NEGATIVE
Spec Grav, UA: 1.005
Urobilinogen, UA: 0.2
pH, UA: 6.5

## 2011-05-05 LAB — HEPATIC FUNCTION PANEL
ALT: 9 U/L (ref 0–35)
AST: 18 U/L (ref 0–37)
Albumin: 4.3 g/dL (ref 3.5–5.2)
Alkaline Phosphatase: 36 U/L — ABNORMAL LOW (ref 39–117)
Bilirubin, Direct: 0.1 mg/dL (ref 0.0–0.3)
Total Bilirubin: 0.7 mg/dL (ref 0.3–1.2)
Total Protein: 7.1 g/dL (ref 6.0–8.3)

## 2011-05-05 LAB — BASIC METABOLIC PANEL
BUN: 15 mg/dL (ref 6–23)
CO2: 26 mEq/L (ref 19–32)
Calcium: 9 mg/dL (ref 8.4–10.5)
Chloride: 108 mEq/L (ref 96–112)
Creatinine, Ser: 0.7 mg/dL (ref 0.4–1.2)
GFR: 88.74 mL/min (ref >60.00)
Glucose, Bld: 90 mg/dL (ref 70–99)
Potassium: 4.8 mEq/L (ref 3.5–5.1)
Sodium: 140 mEq/L (ref 135–145)

## 2011-05-05 LAB — LIPID PANEL
Cholesterol: 170 mg/dL (ref 0–200)
HDL: 54.3 mg/dL (ref >39.00)
LDL Cholesterol: 108 mg/dL — ABNORMAL HIGH (ref 0–99)
Total CHOL/HDL Ratio: 3
Triglycerides: 37 mg/dL (ref 0.0–149.0)
VLDL: 7.4 mg/dL (ref 0.0–40.0)

## 2011-05-05 LAB — TSH: TSH: 2.83 u[IU]/mL (ref 0.35–5.50)

## 2011-05-11 ENCOUNTER — Encounter: Payer: BC Managed Care – PPO | Admitting: Family Medicine

## 2011-05-15 ENCOUNTER — Encounter: Payer: Self-pay | Admitting: Family Medicine

## 2011-05-15 ENCOUNTER — Ambulatory Visit (INDEPENDENT_AMBULATORY_CARE_PROVIDER_SITE_OTHER): Payer: BC Managed Care – PPO | Admitting: Family Medicine

## 2011-05-15 ENCOUNTER — Ambulatory Visit (INDEPENDENT_AMBULATORY_CARE_PROVIDER_SITE_OTHER)
Admission: RE | Admit: 2011-05-15 | Discharge: 2011-05-15 | Disposition: A | Discharge status: Home / Self Care | Payer: BC Managed Care – PPO | Source: Ambulatory Visit | Attending: Family Medicine | Admitting: Family Medicine

## 2011-05-15 VITALS — BP 90/72 | HR 80 | Temp 98.1°F | Resp 12 | Ht 67.0 in | Wt 189.0 lb

## 2011-05-15 DIAGNOSIS — M545 Low back pain, unspecified: Secondary | ICD-10-CM

## 2011-05-15 DIAGNOSIS — Z0000 Encounter for general adult medical examination without abnormal findings: Secondary | ICD-10-CM

## 2011-05-15 DIAGNOSIS — E890 Postprocedural hypothyroidism: Secondary | ICD-10-CM | POA: Insufficient documentation

## 2011-05-15 MED ORDER — CLONAZEPAM 1 MG PO TABS: 1.0000 mg | ORAL_TABLET | Freq: Two times a day (BID) | ORAL | Status: DC | PRN

## 2011-05-15 NOTE — Progress Notes (Signed)
Subjective:    [name], female    DOB: 1961-02-23, 50 y.o.   MRN: [id number]  HPI  Complete physical. Past medical history reviewed. Hyperthyroidism last year and initially treated medically with methimazole but had hives. Subsequently treated with radioactive iodine. Now hypothyroid and on replacement. Overall feels better but has gained substantial weight over the past year. Quit smoking 8 months ago. Has recently started exercising more.  History of breast cancer 6 years ago. Continues to see gynecologist and oncologist regularly. Getting regular mammograms. Received flu shot through work. Tetanus is up to date.  Patient also had right shoulder surgery secondary to labral tear. This is recovering.  She has low back pain right lower lumbar region for over one year. No injury. No weight loss. No radiculopathy symptoms. Denies numbness or weakness lower extremities. No dysuria. No urine incontinence. Pain exacerbated by back flexion. Has seen chiropractor without relief. No x-rays. No alleviating factors.  Past Medical History  Diagnosis Date  . COLONIC POLYPS 10/08/2009  . HYPERTHYROIDISM 11/03/2009  . ALLERGIC RHINITIS 10/08/2009  . Microscopic hematuria 11/01/2009  . Edema 10/08/2009  . THYROID FUNCTION TEST, ABNORMAL 11/01/2009  . NEOPLASM, MALIGNANT, BREAST, HX OF 10/08/2009   Past Surgical History  Procedure Date  . Cholecystectomy   . Breast surgery     breast CA, 6 weeks radiation  . Labrum tear 2/12    reports that she quit smoking about 8 months ago. Her smoking use included Cigarettes. She has a 25 pack-year smoking history. She does not have any smokeless tobacco history on file. Her alcohol and drug histories not on file. family history includes Alcohol abuse in her brother, father, and sister; Arthritis in her mother; and Hypertension in her father. Allergies  Allergen Reactions  . Ferrous Sulfate     REACTION: hives  . Methimazole Hives         Review of Systems  Constitutional: Positive for unexpected weight change. Negative for fever, activity change, appetite change and fatigue.  HENT: Negative for hearing loss, ear pain, sore throat and trouble swallowing.   Eyes: Negative for visual disturbance.  Respiratory: Negative for cough and shortness of breath.   Cardiovascular: Negative for chest pain and palpitations.  Gastrointestinal: Negative for abdominal pain, diarrhea, constipation and blood in stool.  Genitourinary: Negative for dysuria and hematuria.  Musculoskeletal: Positive for back pain. Negative for myalgias and arthralgias.  Skin: Negative for rash.  Neurological: Negative for dizziness, syncope, weakness, numbness and headaches.  Hematological: Negative for adenopathy.  Psychiatric/Behavioral: Negative for confusion and dysphoric mood.       Objective:   Physical Exam  Constitutional: She is oriented to person, place, and time. She appears well-developed and well-nourished.  HENT:  Head: Normocephalic and atraumatic.  Eyes: EOM are normal. Pupils are equal, round, and reactive to light.  Neck: Normal range of motion. Neck supple. No thyromegaly present.  Cardiovascular: Normal rate, regular rhythm and normal heart sounds.   No murmur heard. Pulmonary/Chest: Breath sounds normal. No respiratory distress. She has no wheezes. She has no rales.  Abdominal: Soft. Bowel sounds are normal. She exhibits no distension and no mass. There is no tenderness. There is no rebound and no guarding.  Musculoskeletal: Normal range of motion. She exhibits no edema.       Straight leg raise is negative. She has minimal tenderness to palpation right lower lumbar region  Lymphadenopathy:    She has no cervical adenopathy.  Neurological: She is alert  and oriented to person, place, and time. She displays normal reflexes. No cranial nerve deficit.       Full strength lower extremities with symmetric reflexes  Skin: No rash  noted.  Psychiatric: She has a normal mood and affect. Her behavior is normal. Judgment and thought content normal.          Assessment & Plan:  #1 health maintenance. Congratulated on quitting smoking. Focus on weight loss and regular exercise at this time. Continue GYN followup. Flu shot given already. Tetanus up to date. Colonoscopy in 2 years. #2 hypothyroidism following treatment with radioactive iodine for hyperthyroidism. Followed by endocrinology #3 chronic right lower lumbar back pain for over one year. Given duration, start with x-rays. Discussed core strengthening and stretching exercises. Hopefully this will improve with improved conditioning and weight loss.  #4 history of chronic anxiety. Refill of clonazepam which she uses rarely when necessary for severe anxiety

## 2011-05-17 NOTE — Progress Notes (Signed)
Quick Note:  Pt informed ______ 

## 2011-10-18 LAB — HM MAMMOGRAPHY: HM Mammogram: NEGATIVE

## 2011-10-19 ENCOUNTER — Encounter: Payer: Self-pay | Admitting: Family Medicine

## 2011-11-16 ENCOUNTER — Telehealth: Payer: Self-pay | Admitting: Family Medicine

## 2011-11-16 NOTE — Telephone Encounter (Signed)
Pt informed labs faxed

## 2011-11-16 NOTE — Telephone Encounter (Signed)
Pt called called yesterday and wanted to make sure that the labs results from 05/05/11 to be faxed over to Dr Dawna Part office at [facility]. The fax # is [phone] Alicia Mcconnell. Pls notify pt when this has been done.

## 2011-11-27 ENCOUNTER — Ambulatory Visit: Payer: BC Managed Care – PPO | Attending: Orthopaedic Surgery | Admitting: Physical Therapy

## 2011-11-27 DIAGNOSIS — IMO0001 Reserved for inherently not codable concepts without codable children: Secondary | ICD-10-CM | POA: Insufficient documentation

## 2011-11-27 DIAGNOSIS — M6281 Muscle weakness (generalized): Secondary | ICD-10-CM | POA: Insufficient documentation

## 2011-11-27 DIAGNOSIS — M25519 Pain in unspecified shoulder: Secondary | ICD-10-CM | POA: Insufficient documentation

## 2011-11-27 DIAGNOSIS — M546 Pain in thoracic spine: Secondary | ICD-10-CM | POA: Insufficient documentation

## 2011-11-29 ENCOUNTER — Ambulatory Visit: Payer: BC Managed Care – PPO | Admitting: Physical Therapy

## 2011-12-06 ENCOUNTER — Ambulatory Visit: Payer: BC Managed Care – PPO | Admitting: Physical Therapy

## 2011-12-11 ENCOUNTER — Ambulatory Visit: Payer: BC Managed Care – PPO | Admitting: Physical Therapy

## 2011-12-14 ENCOUNTER — Ambulatory Visit: Payer: BC Managed Care – PPO | Admitting: Physical Therapy

## 2012-08-19 ENCOUNTER — Encounter: Payer: Self-pay | Admitting: Family Medicine

## 2012-08-19 ENCOUNTER — Ambulatory Visit (INDEPENDENT_AMBULATORY_CARE_PROVIDER_SITE_OTHER): Payer: Managed Care, Other (non HMO) | Admitting: Family Medicine

## 2012-08-19 VITALS — BP 120/78 | Temp 99.0°F | Wt 180.0 lb

## 2012-08-19 DIAGNOSIS — M549 Dorsalgia, unspecified: Secondary | ICD-10-CM

## 2012-08-19 DIAGNOSIS — M546 Pain in thoracic spine: Secondary | ICD-10-CM

## 2012-08-19 MED ORDER — CLONAZEPAM 1 MG PO TABS: 1.0000 mg | ORAL_TABLET | Freq: Two times a day (BID) | ORAL | Status: DC | PRN

## 2012-08-19 NOTE — Patient Instructions (Addendum)
Trigger Point Therapy Your exam shows your pain is coming from one or more trigger points. These points are places where the muscles of the neck, shoulder, back, and legs are tender to touch and cause pain, spasm, and fatigue. This condition can cause headaches, painful neck spasms, and low back pain. Trigger points can be thought of as weak areas of muscle that spasm and form hard knots when under chronic strain or stress. Treatment of trigger points may include medicines to reduce pain and inflammation and relax the muscles. Physical therapy can include several techniques. Deep friction massage over the tender areas can often help relieve pain and stiffness. Relaxation exercises and stretching may be helpful; cervical and lumbar traction can be used in severe cases. Cold therapy and hot packs have both proven helpful. Injection therapy with saline, anesthetics, or cortisone medicines, has also been very successful at reducing symptoms. Two or more injections are often needed over several weeks to gain the greatest benefit. Call your doctor to arrange for further treatment as recommended. Document Released: 08/17/2004 Document Revised: 10/02/2011 Document Reviewed: 07/10/2005 [facility] Patient Information 2013 [place], [place].

## 2012-08-19 NOTE — Progress Notes (Signed)
  Subjective:    [name], female    DOB: 1961-04-10, 52 y.o.   MRN: [id number]  HPI Acute visit. Left thoracic back pain. Medial to the scapula. 10 days ago started boot camp. Had some pain then and she took a few days off and pain improved. Try to resume a boot camp this morning and had recurrent pain same region.  She is doing significant upper body exercises. No cough. No dyspnea. No pleuritic pain. Tried Voltaren and Robaxin without much improvement. Pain is worse with movement and to touch. No upper extremity symptoms. No neck pain.   Review of Systems  Constitutional: Negative for appetite change and unexpected weight change.  Respiratory: Negative for shortness of breath.   Cardiovascular: Negative for chest pain.  Musculoskeletal: Positive for back pain.       Objective:   Physical Exam  Constitutional: She appears well-developed and well-nourished.  HENT:  Right Ear: External ear normal.  Left Ear: External ear normal.  Mouth/Throat: Oropharynx is clear and moist.  Neck: Neck supple.  Cardiovascular: Normal rate and regular rhythm.   Pulmonary/Chest: Effort normal and breath sounds normal. No respiratory distress. She has no wheezes. She has no rales.  Musculoskeletal:       Patient has area approximately 1 x 1 cm of localized muscular tenderness left upper back medial to the scapula. No rash. No spinal tenderness. Full range of motion cervical spine          Assessment & Plan:  Localized muscular pain left upper thoracic back. Suspect trigger point. Patient already tried anti-inflammatories, heat, muscle relaxer without improvement. Discussed options such as muscle massage and physical therapy. Discussed injection with Xylocaine and Depo-Medrol and after discussing risk and benefits patient consents. Prepped area with Betadine. Using sterile technique, injected 1 cc Xylocaine and 1 cc Depo-Medrol using 25-gauge needle. Patient tolerated well Avoid  lifting next several days.

## 2012-09-20 ENCOUNTER — Ambulatory Visit (INDEPENDENT_AMBULATORY_CARE_PROVIDER_SITE_OTHER): Payer: Managed Care, Other (non HMO) | Admitting: Family Medicine

## 2012-09-20 ENCOUNTER — Encounter: Payer: Self-pay | Admitting: Family Medicine

## 2012-09-20 VITALS — BP 122/72 | HR 72 | Temp 98.5°F | Resp 12 | Ht 66.75 in | Wt 182.0 lb

## 2012-09-20 DIAGNOSIS — Z0000 Encounter for general adult medical examination without abnormal findings: Secondary | ICD-10-CM

## 2012-09-20 MED ORDER — MOMETASONE FUROATE 0.1 % EX SOLN: Freq: Every day | CUTANEOUS | Status: DC

## 2012-09-20 NOTE — Progress Notes (Signed)
Subjective:    [name], female    DOB: [birth year], 52 y.o.   MRN: [id number]  HPI Here for complete physical.  History of hyperthyroidism followed by radioactive iodine treatment and subsequent hypothyroidism. She is followed by endocrinology. She sees gynecologist regularly. She had remote history of right breast cancer back in 1990s. She had lumpectomy followed by radiation therapy. Estrogen receptor positive. She gets regular mammograms. She's been exercising more and trying to lose some weight recently.  Recurrent eczematous rash ear canals bilaterally. No history of psoriasis  Past Medical History  Diagnosis Date  . COLONIC POLYPS 10/08/2009  . HYPERTHYROIDISM 11/03/2009  . ALLERGIC RHINITIS 10/08/2009  . Microscopic hematuria 11/01/2009  . Edema 10/08/2009  . THYROID FUNCTION TEST, ABNORMAL 11/01/2009  . NEOPLASM, MALIGNANT, BREAST, HX OF 10/08/2009  . 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  ER posivtive      Past Surgical History  Procedure Laterality Date  . Cholecystectomy    . Breast surgery      breast CA, 6 weeks radiation  . Labrum tear Right 2/12    reports that she quit smoking about 2 years ago. Her smoking use included Cigarettes. She has a 25 pack-year smoking history. She does not have any smokeless tobacco history on file. Her alcohol and drug histories are not on file. family history includes Alcohol abuse in her brother, father, and sister; Arthritis (age of onset: 73) in her mother; and Hypertension in her father. Allergies  Allergen Reactions  . Methimazole Hives    hives  . Sulfa Antibiotics     hives      Review of Systems  Constitutional: Negative for fever, activity change, appetite change, fatigue and unexpected weight change.  HENT: Negative for hearing loss, ear pain, sore throat and trouble swallowing.   Eyes: Negative for visual disturbance.  Respiratory: Negative for cough and  shortness of breath.   Cardiovascular: Negative for chest pain and palpitations.  Gastrointestinal: Negative for abdominal pain, diarrhea, constipation and blood in stool.  Genitourinary: Negative for dysuria and hematuria.  Musculoskeletal: Negative for myalgias, back pain and arthralgias.  Skin: Negative for rash.  Neurological: Negative for dizziness, syncope and headaches.  Hematological: Negative for adenopathy.  Psychiatric/Behavioral: Negative for confusion and dysphoric mood.       Objective:   Physical Exam  Constitutional: She is oriented to person, place, and time. She appears well-developed and well-nourished.  HENT:  Head: Normocephalic and atraumatic.  Eyes: EOM are normal. Pupils are equal, round, and reactive to light.  Neck: Normal range of motion. Neck supple. No thyromegaly present.  Cardiovascular: Normal rate, regular rhythm and normal heart sounds.   No murmur heard. Pulmonary/Chest: Breath sounds normal. No respiratory distress. She has no wheezes. She has no rales.  Abdominal: Soft. Bowel sounds are normal. She exhibits no distension and no mass. There is no tenderness. There is no rebound and no guarding.  Genitourinary:  Per gyn  Musculoskeletal: Normal range of motion. She exhibits no edema.  Lymphadenopathy:    She has no cervical adenopathy.  Neurological: She is alert and oriented to person, place, and time. She displays normal reflexes. No cranial nerve deficit.  Skin: Rash noted.  Ear canals mild scaly rash.  No pustules.    Psychiatric: She has a normal mood and affect. Her behavior is normal. Judgment and thought content normal.          Assessment & Plan:  Complete physical. Patient had recent TSH which was low. She will discuss with endocrinologist. She appears to be over replaced with thyroid medication. Elocon lotion for ear rash.  She quit smoking 2 years ago and has started more consistent exercise program.  We discussed getting  additional labs such as lipids next year.

## 2012-10-17 ENCOUNTER — Encounter: Payer: Self-pay | Admitting: Family Medicine

## 2012-10-17 LAB — HM MAMMOGRAPHY

## 2012-10-29 ENCOUNTER — Telehealth: Payer: Self-pay | Admitting: Family Medicine

## 2012-10-29 NOTE — Telephone Encounter (Signed)
We do not have any 2014 labs in Epic or in Misc scanned to chart.  We will fax 2012 labs

## 2012-10-29 NOTE — Telephone Encounter (Signed)
Pt states she had labs done, MD's office thought it was here.  They requested lastest thyroid test, and received 2012 results.  Dr Sharl Ma office looking for 2014 results.  No labs done here, they ask did she go to another lab? Would like latest labs done for ANYTHING she has had recently. Pls advise. Fax: [phone]  Attn: Lowanda Foster  Pt has appt at their office Thurs.

## 2012-11-21 ENCOUNTER — Other Ambulatory Visit: Payer: Self-pay | Admitting: Obstetrics and Gynecology

## 2013-05-06 ENCOUNTER — Encounter: Payer: Self-pay | Admitting: Family Medicine

## 2013-11-27 ENCOUNTER — Other Ambulatory Visit: Payer: Self-pay | Admitting: Obstetrics and Gynecology

## 2013-12-19 ENCOUNTER — Encounter: Payer: Self-pay | Admitting: Family Medicine

## 2014-02-27 ENCOUNTER — Ambulatory Visit (INDEPENDENT_AMBULATORY_CARE_PROVIDER_SITE_OTHER): Payer: Managed Care, Other (non HMO) | Admitting: Physician Assistant

## 2014-02-27 ENCOUNTER — Encounter: Payer: Self-pay | Admitting: Physician Assistant

## 2014-02-27 VITALS — BP 120/80 | HR 72 | Temp 98.2°F | Resp 18 | Wt 190.0 lb

## 2014-02-27 DIAGNOSIS — M25512 Pain in left shoulder: Secondary | ICD-10-CM

## 2014-02-27 DIAGNOSIS — M25519 Pain in unspecified shoulder: Secondary | ICD-10-CM

## 2014-02-27 MED ORDER — METHYLPREDNISOLONE ACETATE 40 MG/ML IJ SUSP
40.0000 mg | Freq: Once | INTRAMUSCULAR | Status: AC
  Administered 2014-02-27: 40 mg via INTRA_ARTICULAR

## 2014-02-27 NOTE — Progress Notes (Signed)
Subjective:    [name], female    DOB: April 05, 1961, 53 y.o.   MRN: [id number]  HPI Patient is a 53 y.o. female presenting for scapular area muscle pain. Pt has had this in the past 1 time and had gotten a trigger point injection for it. She states this helped then. This has been present for 2 weeks. She doesn't remember any injury, however has been exercising more lately and thinks this was perhaps what triggered the pain. 5/10 pain, sharp pain, localized at left scapula, but also radiates into neck. She denies any numbness or tingling. No shooting pain down her arms, no bowel or bladder incontinence. She has tried some Diclofenac, ibuprofen and even a little bit of Robaxin, none of which seemed to help. Patient denies fevers, chills, nausea, vomiting, diarrhea, shortness of breath, chest pain, headache, syncope.    Review of Systems As per HPI and are otherwise negative.    Past Medical History  Diagnosis Date  . COLONIC POLYPS 10/08/2009  . HYPERTHYROIDISM 11/03/2009  . ALLERGIC RHINITIS 10/08/2009  . Microscopic hematuria 11/01/2009  . Edema 10/08/2009  . THYROID FUNCTION TEST, ABNORMAL 11/01/2009  . NEOPLASM, MALIGNANT, BREAST, HX OF 10/08/2009  . 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 Marital Status: Divorced    Spouse Name: N/A    Number of Children: N/A  . Years of Education: N/A   Occupational History  . Not on file.   Social History Main Topics  . Smoking status: Former Smoker -- 1.00 packs/day for 25 years    Types: Cigarettes    Quit date: 09/13/2010  . Smokeless tobacco: Not on file  . Alcohol Use: Not on file  . Drug Use: Not on file  . Sexual Activity: Not on file   Other Topics Concern  . Not on file   Social History Narrative  . No narrative on file    Past Surgical History  Procedure Laterality Date  . Cholecystectomy    . Breast surgery        breast CA, 6 weeks radiation  . Labrum tear Right 2/12    Family History  Problem Relation Age of Onset  . Arthritis Mother 22    RA  . Alcohol abuse Father   . Hypertension Father   . Alcohol abuse Sister   . Alcohol abuse Brother     Allergies  Allergen Reactions  . Methimazole Hives    hives  . Sulfa Antibiotics     hives    Current Outpatient Prescriptions on File Prior to Visit  Medication Sig Dispense Refill  . Brimonidine Tartrate (MIRVASO) 0.33 % GEL Apply topically. Per Dr Alicia Mcconnell      . clonazePAM (KLONOPIN) 1 MG tablet Take 1 tablet (1 mg total) by mouth 2 (two) times daily as needed for anxiety. 1/2 tab as needed  30 tablet  2  . levothyroxine (SYNTHROID, LEVOTHROID) 137 MCG tablet Take 137 mcg by mouth daily. Dr Alicia Mcconnell      . mometasone (ELOCON) 0.1 % lotion Apply topically daily.  60 mL  3  . mometasone (NASONEX) 50 MCG/ACT nasal spray Place 2 sprays into the nose daily.        . metroNIDAZOLE (METROGEL) 0.75 % gel Apply topically 2 (two) times daily. Per Dr Alicia Mcconnell       No current facility-administered medications  on file prior to visit.    EXAM: BP 120/80  Pulse 72  Temp(Src) 98.2 F (36.8 C) (Oral)  Resp 18  Wt 190 lb (86.183 kg)     Objective:   Physical Exam  Nursing note and vitals reviewed. Constitutional: She is oriented to person, place, and time. She appears well-developed and well-nourished. No distress.  HENT:  Head: Normocephalic and atraumatic.  Eyes: Conjunctivae and EOM are normal. Pupils are equal, round, and reactive to light.  Neck: Normal range of motion. Neck supple.  Cardiovascular: Normal rate, regular rhythm and intact distal pulses.   Pulmonary/Chest: Effort normal and breath sounds normal. No respiratory distress.  Musculoskeletal: Normal range of motion. She exhibits tenderness (directly over the left scapular area.). She exhibits no edema.  Neurological: She is alert and oriented to person, place, and time.  Skin:  Skin is warm and dry. No rash noted. She is not diaphoretic. No erythema. No pallor.  Psychiatric: She has a normal mood and affect. Her behavior is normal. Judgment and thought content normal.    Lab Results  Component Value Date   WBC 5.1 05/05/2011   HGB 13.9 05/05/2011   HCT 41.3 05/05/2011   PLT 224.0 05/05/2011   GLUCOSE 90 05/05/2011   CHOL 170 05/05/2011   TRIG 37.0 05/05/2011   HDL 54.30 05/05/2011   LDLCALC 108* 05/05/2011   ALT 9 05/05/2011   AST 18 05/05/2011   NA 140 05/05/2011   K 4.8 05/05/2011   CL 108 05/05/2011   CREATININE 0.7 05/05/2011   BUN 15 05/05/2011   CO2 26 05/05/2011   TSH 2.83 05/05/2011         Assessment & Plan:   Alicia Mcconnell was seen today for left scapula/ muscle pain.  Diagnoses and associated orders for this visit:  Pain in joint, shoulder region, left Comments: Muscle relaxers and NSAIDs ineffective so far. Trigger point injection therapy.  - methylPREDNISolone acetate (DEPO-MEDROL) injection 40 mg; Inject 1 mL (40 mg total) into the articular space once.    Discussed injection with Xylocaine and Depo-Medrol and after discussing risk and benefits patient provides verbal consent. Prepped area with Betadine. Using sterile technique, injected 1 cc Xylocaine and 1 cc Depo-Medrol using 25-gauge needle. Patient tolerated well  Discussed massaging area with pt to help loosen the spasming muscle. No heavy lifting for the next several days.  Return precautions provided, and patient handout on trigger point therapy.  Plan to follow up as needed, or for worsening or persistent symptoms despite treatment.  Patient Instructions   Please call on Monday to let us know how you are doing.   If emergency symptoms discussed during visit developed, seek medical attention immediately.  Followup as needed, or for worsening or persistent symptoms despite treatment.   Trigger Point Therapy  Your exam shows your pain is coming from one or more  trigger points. These points are places where the muscles of the neck, shoulder, back, and legs are tender to touch and cause pain, spasm, and fatigue. This condition can cause headaches, painful neck spasms, and low back pain. Trigger points can be thought of as weak areas of muscle that spasm and form hard knots when under chronic strain or stress. Treatment of trigger points may include medicines to reduce pain and inflammation and relax the muscles.  Physical therapy can include several techniques. Deep friction massage over the tender areas can often help relieve pain and stiffness. Relaxation exercises and stretching may be helpful; cervical  and lumbar traction can be used in severe cases. Cold therapy and hot packs have both proven helpful. Injection therapy with saline, anesthetics, or cortisone medicines, has also been very successful at reducing symptoms. Two or more injections are often needed over several weeks to gain the greatest benefit. Call your doctor to arrange for further treatment as recommended.  Document Released: 08/17/2004 Document Revised: 10/02/2011 Document Reviewed: 07/10/2005  [facility] Patient Information 2013 [place].

## 2014-02-27 NOTE — Patient Instructions (Addendum)
Please call on Monday to let us know how you are doing.   If emergency symptoms discussed during visit developed, seek medical attention immediately.  Followup as needed, or for worsening or persistent symptoms despite treatment.   Trigger Point Therapy  Your exam shows your pain is coming from one or more trigger points. These points are places where the muscles of the neck, shoulder, back, and legs are tender to touch and cause pain, spasm, and fatigue. This condition can cause headaches, painful neck spasms, and low back pain. Trigger points can be thought of as weak areas of muscle that spasm and form hard knots when under chronic strain or stress. Treatment of trigger points may include medicines to reduce pain and inflammation and relax the muscles.  Physical therapy can include several techniques. Deep friction massage over the tender areas can often help relieve pain and stiffness. Relaxation exercises and stretching may be helpful; cervical and lumbar traction can be used in severe cases. Cold therapy and hot packs have both proven helpful. Injection therapy with saline, anesthetics, or cortisone medicines, has also been very successful at reducing symptoms. Two or more injections are often needed over several weeks to gain the greatest benefit. Call your doctor to arrange for further treatment as recommended.  Document Released: 08/17/2004 Document Revised: 10/02/2011 Document Reviewed: 07/10/2005  [facility] Patient Information 2013 [place].

## 2014-02-27 NOTE — Progress Notes (Signed)
Pre visit review using our clinic review tool, if applicable. No additional management support is needed unless otherwise documented below in the visit note. 

## 2014-03-05 ENCOUNTER — Telehealth: Payer: Self-pay | Admitting: Family Medicine

## 2014-03-05 NOTE — Telephone Encounter (Signed)
FYI Pt is calling to let PA know the trigger injection is working. Pt was seen on 02-27-14.

## 2014-04-30 ENCOUNTER — Encounter: Payer: Self-pay | Admitting: Family Medicine

## 2014-04-30 ENCOUNTER — Ambulatory Visit (INDEPENDENT_AMBULATORY_CARE_PROVIDER_SITE_OTHER): Payer: Managed Care, Other (non HMO) | Admitting: Family Medicine

## 2014-04-30 VITALS — BP 128/78 | HR 65 | Temp 98.4°F | Wt 191.0 lb

## 2014-04-30 DIAGNOSIS — Z23 Encounter for immunization: Secondary | ICD-10-CM

## 2014-04-30 DIAGNOSIS — M25519 Pain in unspecified shoulder: Secondary | ICD-10-CM

## 2014-04-30 MED ORDER — METHYLPREDNISOLONE ACETATE 40 MG/ML IJ SUSP
40.0000 mg | Freq: Once | INTRAMUSCULAR | Status: AC
  Administered 2014-04-30: 40 mg via INTRA_ARTICULAR

## 2014-04-30 MED ORDER — CYCLOBENZAPRINE HCL 5 MG PO TABS: 5.0000 mg | ORAL_TABLET | Freq: Every day | ORAL | Status: DC

## 2014-04-30 NOTE — Progress Notes (Signed)
   Subjective:    [name], female    DOB: [birth year], 53 y.o.   MRN: [id number]  Shoulder Pain    Recurrent pain just superior to left scapular region. She's had similar flareups in the past. History of trigger point injections. She received injection in August which did help for about 10 days. She frequently types with infrequent change of position.. No specific injury but she did seem to have recent flare after doing several pushups. No radiculopathy symptoms. No cervical neck pain. No rashes. No dyspnea. No pleuritic pain or cough.  She tried muscle massage and topical rubs without much improvement. Also tried over-the-counter anti-inflammatories.  Past Medical History  Diagnosis Date  . COLONIC POLYPS 10/08/2009  . HYPERTHYROIDISM 11/03/2009  . ALLERGIC RHINITIS 10/08/2009  . Microscopic hematuria 11/01/2009  . Edema 10/08/2009  . THYROID FUNCTION TEST, ABNORMAL 11/01/2009  . NEOPLASM, MALIGNANT, BREAST, HX OF 10/08/2009  . 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  ER posivtive      Past Surgical History  Procedure Laterality Date  . Cholecystectomy    . Breast surgery      breast CA, 6 weeks radiation  . Labrum tear Right 2/12    reports that she quit smoking about 3 years ago. Her smoking use included Cigarettes. She has a 25 pack-year smoking history. She does not have any smokeless tobacco history on file. Her alcohol and drug histories are not on file. family history includes Alcohol abuse in her brother, father, and sister; Arthritis (age of onset: 35) in her mother; Hypertension in her father. Allergies  Allergen Reactions  . Methimazole Hives    hives  . Sulfa Antibiotics     hives      Review of Systems  Respiratory: Negative for cough and shortness of breath.   Cardiovascular: Negative for chest pain.  Skin: Negative for rash.       Objective:   Physical Exam  Constitutional: She appears well-developed  and well-nourished. No distress.  Cardiovascular: Normal rate and regular rhythm.   Pulmonary/Chest: Effort normal and breath sounds normal. No respiratory distress. She has no wheezes. She has no rales.  Musculoskeletal:  Patient has approximately 2 x 2 centimeter area of soreness just superior to her left scapular region. No spinal tenderness. Full range of motion cervical spine          Assessment & Plan:  Trigger point left upper back. We recommended continued heat and muscle massage. Try low-dose cyclobenzaprine 5 mg each bedtime. Discussed risk and benefits of trigger point injection. We explained we would not inject again over the next 6 months if she has recurrence. Patient consented. Prepped with Betadine. Using 25-gauge one-inch needle injected 1 cc Depo-Medrol and 1 cc of plain Xylocaine. Pt tolerated well

## 2014-04-30 NOTE — Patient Instructions (Signed)
Trigger Point Injection Trigger points are areas where you have muscle pain. A trigger point injection is a shot given in the trigger point to relieve that pain. A trigger point might feel like a knot in your muscle. It hurts to press on a trigger point. Sometimes the pain spreads out (radiates) to other parts of the body. For example, pressing on a trigger point in your shoulder might cause pain in your arm or neck. You might have one trigger point. Or, you might have more than one. People often have trigger points in their upper back and lower back. They also occur often in the neck and shoulders. Pain from a trigger point lasts for a long time. It can make it hard to keep moving. You might not be able to do the exercise or physical therapy that could help you deal with the pain. A trigger point injection may help. It does not work for everyone. But, it may relieve your pain for a few days or a few months. A trigger point injection does not cure long-lasting (chronic) pain. LET YOUR CAREGIVER KNOW ABOUT:  Any allergies (especially to latex, lidocaine, or steroids).  Blood-thinning medicines that you take. These drugs can lead to bleeding or bruising after an injection. They include:  Aspirin.  Ibuprofen.  Clopidogrel.  Warfarin.  Other medicines you take. This includes all vitamins, herbs, eyedrops, over-the-counter medicines, and creams.  Use of steroids.  Recent infections.  Past problems with numbing medicines.  Bleeding problems.  Surgeries you have had.  Other health problems. RISKS AND COMPLICATIONS A trigger point injection is a safe treatment. However, problems may develop, such as:  Minor side effects usually go away in 1 to 2 days. These may include:  Soreness.  Bruising.  Stiffness.  More serious problems are rare. But, they may include:  Bleeding under the skin (hematoma).  Skin infection.  Breaking off of the needle under your skin.  Lung  puncture.  The trigger point injection may not work for you. BEFORE THE PROCEDURE You may need to stop taking any medicine that thins your blood. This is to prevent bleeding and bruising. Usually these medicines are stopped several days before the injection. No other preparation is needed. PROCEDURE  A trigger point injection can be given in your caregiver's office or in a clinic. Each injection takes 2 minutes or less.  Your caregiver will feel for trigger points. The caregiver may use a marker to circle the area for the injection.  The skin over the trigger point will be washed with a germ-killing (antiseptic) solution.  The caregiver pinches the spot for the injection.  Then, a very thin needle is used for the shot. You may feel pain or a twitching feeling when the needle enters the trigger point.  A numbing solution may be injected into the trigger point. Sometimes a drug to keep down swelling, redness, and warmth (inflammation) is also injected.  Your caregiver moves the needle around the trigger zone until the tightness and twitching goes away.  After the injection, your caregiver may put gentle pressure over the injection site.  Then it is covered with a bandage. AFTER THE PROCEDURE  You can go right home after the injection.  The bandage can be taken off after a few hours.  You may feel sore and stiff for 1 to 2 days.  Go back to your regular activities slowly. Your caregiver may ask you to stretch your muscles. Do not do anything that takes   extra energy for a few days.  Follow your caregiver's instructions to manage and treat other pain. Document Released: 06/29/2011 Document Revised: 11/04/2012 Document Reviewed: 06/29/2011 [employer] Patient Information 2015 [employer]. This information is not intended to replace advice given to you by your health care provider. Make sure you discuss any questions you have with your health care provider.  

## 2014-04-30 NOTE — Progress Notes (Signed)
Pre visit review using our clinic review tool, if applicable. No additional management support is needed unless otherwise documented below in the visit note. 

## 2014-06-01 ENCOUNTER — Encounter: Payer: Self-pay | Admitting: Family Medicine

## 2014-06-01 ENCOUNTER — Ambulatory Visit (INDEPENDENT_AMBULATORY_CARE_PROVIDER_SITE_OTHER): Payer: Managed Care, Other (non HMO) | Admitting: Family Medicine

## 2014-06-01 VITALS — BP 104/69 | HR 60 | Temp 98.1°F | Ht 66.75 in | Wt 193.0 lb

## 2014-06-01 DIAGNOSIS — S63619A Unspecified sprain of unspecified finger, initial encounter: Secondary | ICD-10-CM

## 2014-06-01 NOTE — Progress Notes (Signed)
Pre visit review using our clinic review tool, if applicable. No additional management support is needed unless otherwise documented below in the visit note. 

## 2014-06-01 NOTE — Progress Notes (Signed)
   Subjective:    [name], female    DOB: 27-Apr-1961, 53 y.o.   MRN: [id number]  HPI Here for pain in the right thumb after she fell on it about one month ago. While stepping up onto a landing she slipped and fell backwards, and she reached her hands behind her. As she landed she felt a "crunch" in the thumb. It was immediately painful and swelled. She iced it and made a home made splint that she wore for a week. The swelling has gone down but she still has pain along the IP joint.    Review of Systems  Constitutional: Negative.        Objective:   Physical Exam  Constitutional: She appears well-developed and well-nourished.  Musculoskeletal:  The right thumb is very tender along the lateral edge of the IP joint. No crepitus and no swelling. The ROM is full          Assessment & Plan:  Possible ligamentous damage. Refer to Hand Surgery.

## 2014-12-17 ENCOUNTER — Other Ambulatory Visit: Payer: Self-pay | Admitting: Obstetrics and Gynecology

## 2014-12-18 LAB — CYTOLOGY - PAP

## 2015-01-06 ENCOUNTER — Encounter: Payer: Self-pay | Admitting: Family Medicine

## 2015-05-24 ENCOUNTER — Telehealth: Payer: Self-pay | Admitting: Family Medicine

## 2015-05-24 NOTE — Telephone Encounter (Signed)
Pt needs new rx clonazepam 1 mg #30 send to [facility] [address]. This med was prescribed by her ob-gyn who has retired and she has not seen new provider yet

## 2015-05-25 NOTE — Telephone Encounter (Signed)
Pt is aware md out of office this week

## 2015-05-25 NOTE — Telephone Encounter (Signed)
Patient has not been seen by Dr. Elease Hashimoto since 04/2014 and medication has not been prescribed by Dr. Elease Hashimoto since 2014. Patient will need an appointment to address resuming medication management. Please schedule appointment.

## 2015-05-25 NOTE — Telephone Encounter (Signed)
Pt has been sch for 05/31/15 345 pm

## 2015-05-31 ENCOUNTER — Ambulatory Visit (INDEPENDENT_AMBULATORY_CARE_PROVIDER_SITE_OTHER): Payer: Managed Care, Other (non HMO) | Admitting: Family Medicine

## 2015-05-31 ENCOUNTER — Encounter: Payer: Self-pay | Admitting: Family Medicine

## 2015-05-31 VITALS — BP 100/70 | HR 64 | Temp 98.1°F | Resp 16 | Ht 66.75 in | Wt 183.7 lb

## 2015-05-31 DIAGNOSIS — G4700 Insomnia, unspecified: Secondary | ICD-10-CM

## 2015-05-31 DIAGNOSIS — F5104 Psychophysiologic insomnia: Secondary | ICD-10-CM

## 2015-05-31 DIAGNOSIS — Z23 Encounter for immunization: Secondary | ICD-10-CM

## 2015-05-31 MED ORDER — CLONAZEPAM 1 MG PO TABS: 1.0000 mg | ORAL_TABLET | Freq: Two times a day (BID) | ORAL | Status: DC | PRN

## 2015-05-31 NOTE — Progress Notes (Signed)
Pre visit review using our clinic review tool, if applicable. No additional management support is needed unless otherwise documented below in the visit note. 

## 2015-05-31 NOTE — Progress Notes (Signed)
   Subjective:    [name], female    DOB: [birth year], 54 y.o.   MRN: [id number]  HPI  Patient seen for medical follow-up. She has hypothyroidism followed by endocrinology. She has long history of chronic insomnia and anxiety and has been prescribed clonazepam per GYN for several years. She takes this mostly for sleep at night and his tried to stick to low dosage of one half milligram daily at bedtime. She has tried other things in the past such as Ambien which did not work well. She's tried over-the-counter medications without relief. She avoids caffeine late in the day. No regular alcohol use. Denies depression history recently  Past Medical History  Diagnosis Date  . COLONIC POLYPS 10/08/2009  . HYPERTHYROIDISM 11/03/2009  . ALLERGIC RHINITIS 10/08/2009  . Microscopic hematuria 11/01/2009  . Edema 10/08/2009  . THYROID FUNCTION TEST, ABNORMAL 11/01/2009  . NEOPLASM, MALIGNANT, BREAST, HX OF 10/08/2009  . 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  ER posivtive      Past Surgical History  Procedure Laterality Date  . Cholecystectomy    . Breast surgery      breast CA, 6 weeks radiation  . Labrum tear Right 2/12    reports that she quit smoking about 4 years ago. Her smoking use included Cigarettes. She has a 25 pack-year smoking history. She has never used smokeless tobacco. She reports that she drinks alcohol. She reports that she does not use illicit drugs. family history includes Alcohol abuse in her brother, father, and sister; Arthritis (age of onset: 69) in her mother; Hypertension in her father. Allergies  Allergen Reactions  . Methimazole Hives    hives  . Sulfa Antibiotics     hives     Review of Systems  Constitutional: Negative for appetite change and unexpected weight change.  Respiratory: Negative for cough and shortness of breath.   Cardiovascular: Negative for chest pain.  Neurological: Negative for  dizziness and headaches.  Psychiatric/Behavioral: Positive for sleep disturbance. Negative for dysphoric mood. The patient is nervous/anxious.        Objective:   Physical Exam  Constitutional: She appears well-developed and well-nourished. No distress.  Neck: Neck supple. No thyromegaly present.  Cardiovascular: Normal rate and regular rhythm.   Pulmonary/Chest: Effort normal and breath sounds normal. No respiratory distress. She has no wheezes. She has no rales.  Psychiatric: She has a normal mood and affect. Her behavior is normal. Judgment and thought content normal.          Assessment & Plan:  Chronic insomnia. Sleep hygiene discussed. Handout given. Discussed nonpharmacologic ways to try to help with sleep. Refill clonazepam for as needed use. Flu vaccine given

## 2015-05-31 NOTE — Patient Instructions (Signed)
Insomnia Insomnia is a sleep disorder that makes it difficult to fall asleep or to stay asleep. Insomnia can cause tiredness (fatigue), low energy, difficulty concentrating, mood swings, and poor performance at work or school.  There are three different ways to classify insomnia:  Difficulty falling asleep.  Difficulty staying asleep.  Waking up too early in the morning. Any type of insomnia can be long-term (chronic) or short-term (acute). Both are common. Short-term insomnia usually lasts for three months or less. Chronic insomnia occurs at least three times a week for longer than three months. CAUSES  Insomnia may be caused by another condition, situation, or substance, such as:  Anxiety.  Certain medicines.  Gastroesophageal reflux disease (GERD) or other gastrointestinal conditions.  Asthma or other breathing conditions.  Restless legs syndrome, sleep apnea, or other sleep disorders.  Chronic pain.  Menopause. This may include hot flashes.  Stroke.  Abuse of alcohol, tobacco, or illegal drugs.  Depression.  Caffeine.   Neurological disorders, such as Alzheimer disease.  An overactive thyroid (hyperthyroidism). The cause of insomnia may not be known. RISK FACTORS Risk factors for insomnia include:  Gender. Women are more commonly affected than men.  Age. Insomnia is more common as you get older.  Stress. This may involve your professional or personal life.  Income. Insomnia is more common in people with lower income.  Lack of exercise.   Irregular work schedule or night shifts.  Traveling between different time zones. SIGNS AND SYMPTOMS If you have insomnia, trouble falling asleep or trouble staying asleep is the main symptom. This may lead to other symptoms, such as:  Feeling fatigued.  Feeling nervous about going to sleep.  Not feeling rested in the morning.  Having trouble concentrating.  Feeling irritable, anxious, or depressed. TREATMENT   Treatment for insomnia depends on the cause. If your insomnia is caused by an underlying condition, treatment will focus on addressing the condition. Treatment may also include:   Medicines to help you sleep.  Counseling or therapy.  Lifestyle adjustments. HOME CARE INSTRUCTIONS   Take medicines only as directed by your health care provider.  Keep regular sleeping and waking hours. Avoid naps.  Keep a sleep diary to help you and your health care provider figure out what could be causing your insomnia. Include:   When you sleep.  When you wake up during the night.  How well you sleep.   How rested you feel the next day.  Any side effects of medicines you are taking.  What you eat and drink.   Make your bedroom a comfortable place where it is easy to fall asleep:  Put up shades or special blackout curtains to block light from outside.  Use a white noise machine to block noise.  Keep the temperature cool.   Exercise regularly as directed by your health care provider. Avoid exercising right before bedtime.  Use relaxation techniques to manage stress. Ask your health care provider to suggest some techniques that may work well for you. These may include:  Breathing exercises.  Routines to release muscle tension.  Visualizing peaceful scenes.  Cut back on alcohol, caffeinated beverages, and cigarettes, especially close to bedtime. These can disrupt your sleep.  Do not overeat or eat spicy foods right before bedtime. This can lead to digestive discomfort that can make it hard for you to sleep.  Limit screen use before bedtime. This includes:  Watching TV.  Using your smartphone, tablet, and computer.  Stick to a routine. This   can help you fall asleep faster. Try to do a quiet activity, brush your teeth, and go to bed at the same time each night.  Get out of bed if you are still awake after 15 minutes of trying to sleep. Keep the lights down, but try reading or  doing a quiet activity. When you feel sleepy, go back to bed.  Make sure that you drive carefully. Avoid driving if you feel very sleepy.  Keep all follow-up appointments as directed by your health care provider. This is important. SEEK MEDICAL CARE IF:   You are tired throughout the day or have trouble in your daily routine due to sleepiness.  You continue to have sleep problems or your sleep problems get worse. SEEK IMMEDIATE MEDICAL CARE IF:   You have serious thoughts about hurting yourself or someone else.   This information is not intended to replace advice given to you by your health care provider. Make sure you discuss any questions you have with your health care provider.   Document Released: 07/07/2000 Document Revised: 03/31/2015 Document Reviewed: 04/10/2014 Elsevier Interactive Patient Education 2016 [employer]  

## 2016-02-28 ENCOUNTER — Other Ambulatory Visit: Payer: Self-pay | Admitting: Family Medicine

## 2016-02-28 NOTE — Telephone Encounter (Signed)
Pt. Requesting clonazePAM 1mg   Last filled: 05/31/15 #60 refills: 3   Last seen in office 05/31/15    Okay to refill?

## 2016-02-28 NOTE — Telephone Encounter (Signed)
Refill once.  Would recommend if possible she try tapering off- could try 1/2 qhs for 2 weeks or so and then try to d/c

## 2016-03-22 ENCOUNTER — Other Ambulatory Visit: Payer: Self-pay | Admitting: Obstetrics and Gynecology

## 2016-03-23 LAB — CYTOLOGY - PAP

## 2016-06-02 LAB — HM MAMMOGRAPHY: HM Mammogram: NORMAL (ref 0–4)

## 2016-06-07 ENCOUNTER — Encounter: Payer: Self-pay | Admitting: Family Medicine

## 2017-01-21 ENCOUNTER — Emergency Department (INDEPENDENT_AMBULATORY_CARE_PROVIDER_SITE_OTHER)
Admission: EM | Admit: 2017-01-21 | Discharge: 2017-01-21 | Disposition: A | Discharge status: Home / Self Care | Payer: 59 | Source: Home / Self Care | Attending: Family Medicine | Admitting: Family Medicine

## 2017-01-21 ENCOUNTER — Encounter: Payer: Self-pay | Admitting: Emergency Medicine

## 2017-01-21 ENCOUNTER — Emergency Department (INDEPENDENT_AMBULATORY_CARE_PROVIDER_SITE_OTHER): Payer: 59

## 2017-01-21 DIAGNOSIS — R05 Cough: Secondary | ICD-10-CM

## 2017-01-21 DIAGNOSIS — F17200 Nicotine dependence, unspecified, uncomplicated: Secondary | ICD-10-CM | POA: Diagnosis not present

## 2017-01-21 DIAGNOSIS — I517 Cardiomegaly: Secondary | ICD-10-CM

## 2017-01-21 DIAGNOSIS — R0989 Other specified symptoms and signs involving the circulatory and respiratory systems: Secondary | ICD-10-CM

## 2017-01-21 DIAGNOSIS — R590 Localized enlarged lymph nodes: Secondary | ICD-10-CM

## 2017-01-21 DIAGNOSIS — R053 Chronic cough: Secondary | ICD-10-CM

## 2017-01-21 HISTORY — DX: Thyrotoxicosis with diffuse goiter without thyrotoxic crisis or storm: E05.00

## 2017-01-21 MED ORDER — ALBUTEROL SULFATE HFA 108 (90 BASE) MCG/ACT IN AERS: 1.0000 | INHALATION_SPRAY | Freq: Four times a day (QID) | RESPIRATORY_TRACT | 0 refills | Status: DC | PRN

## 2017-01-21 MED ORDER — CETIRIZINE HCL 10 MG PO TABS: 10.0000 mg | ORAL_TABLET | Freq: Every day | ORAL | 1 refills | Status: DC

## 2017-01-21 MED ORDER — PREDNISONE 20 MG PO TABS: ORAL_TABLET | ORAL | 0 refills | Status: DC

## 2017-01-21 NOTE — ED Triage Notes (Signed)
Pt c/o productive cough, clear phlegm, wheezing on and off for 6 months, pt feeling fatigue.

## 2017-01-21 NOTE — ED Provider Notes (Signed)
CSN: [id number]     Arrival date & time 01/21/17  1625 History   First MD Initiated Contact with Patient 01/21/17 1646     Chief Complaint  Patient presents with  . Cough   (Consider location/radiation/quality/duration/timing/severity/associated sxs/prior Treatment) HPI  Alicia Mcconnell is a 56 y.o. female presenting to UC with c/o 6 months of minimally productive cough with clear sputum, intermittent wheeze, post-nasal drip and feeling fatigued.  Cough is worse during the day. It does not worsen with laying down and does not keep her up at night. Denies chest pain or acid reflux.  She was initially seen by her PCP for cough, dx with a viral illness but was not improving so a Z-pak was called in for her. She did complete the medication but cough never resolved.  She did not f/u until now due to a busy schedule. She plans to go on vacation out of the country later this month but would like her symptoms to improve before then.  Hx of cigarette smoking. She had quit for about 6 years but picked it up again 6 months ago.  She has used albuterol in the past but does not currently have an inhaler. Albuterol worsens her anxiety but she notes current symptoms are making her anxiety worse recently.  Denies sick contacts.    Past Medical History:  Diagnosis Date  . ALLERGIC RHINITIS 10/08/2009  . 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  ER posivtive     . COLONIC POLYPS 10/08/2009  . Edema 10/08/2009  . Graves disease   . HYPERTHYROIDISM 11/03/2009  . Microscopic hematuria 11/01/2009  . NEOPLASM, MALIGNANT, BREAST, HX OF 10/08/2009  . THYROID FUNCTION TEST, ABNORMAL 11/01/2009   Past Surgical History:  Procedure Laterality Date  . BREAST SURGERY     breast CA, 6 weeks radiation  . CHOLECYSTECTOMY    . labrum tear Right 2/12   Family History  Problem Relation Age of Onset  . Arthritis Mother 69       RA  . Alcohol abuse Father   . Hypertension Father    . Alcohol abuse Sister   . Alcohol abuse Brother    Social History  Substance Use Topics  . Smoking status: Current Every Day Smoker    Packs/day: 0.50    Years: 25.00    Types: Cigarettes    Last attempt to quit: 09/13/2010  . Smokeless tobacco: Never Used  . Alcohol use 0.0 oz/week     Comment: rare   OB History    No data available     Review of Systems  Constitutional: Negative for chills and fever.  HENT: Positive for congestion and postnasal drip. Negative for ear pain, sore throat, trouble swallowing and voice change.   Respiratory: Positive for cough. Negative for shortness of breath.   Cardiovascular: Negative for chest pain and palpitations.  Gastrointestinal: Negative for abdominal pain, diarrhea, nausea and vomiting.  Musculoskeletal: Negative for arthralgias, back pain and myalgias.  Skin: Negative for rash.    Allergies  Methimazole and Sulfa antibiotics  Home Medications   Prior to Admission medications   Medication Sig Start Date End Date Taking? Authorizing Provider  albuterol (PROVENTIL HFA;VENTOLIN HFA) 108 (90 Base) MCG/ACT inhaler Inhale 1-2 puffs into the lungs every 6 (six) hours as needed for wheezing or shortness of breath. 01/21/17   Noe Gens, PA-C  cetirizine (ZYRTEC) 10 MG tablet Take 1 tablet (10 mg total) by mouth daily.  For at least 2 weeks 01/21/17   Noe Gens, PA-C  levothyroxine (SYNTHROID, LEVOTHROID) 125 MCG tablet Take 125 mcg by mouth daily before breakfast.    [provider]  predniSONE (DELTASONE) 20 MG tablet 3 tabs po day one, then 2 po daily x 4 days 01/21/17   Noe Gens, PA-C   Meds Ordered and Administered this Visit  Medications - No data to display  BP 114/79 (BP Location: Left Arm)   Pulse 80   Temp 98.6 F (37 C) (Oral)   Ht 5' 6.5" (1.689 m)   Wt 176 lb 4 oz (79.9 kg)   SpO2 98%   BMI 28.02 kg/m  No data found.   Physical Exam  Constitutional: She is oriented to person, place, and time. She  appears well-developed and well-nourished. No distress.  HENT:  Head: Normocephalic and atraumatic.  Right Ear: Tympanic membrane normal.  Left Ear: Tympanic membrane normal.  Nose: Nose normal.  Mouth/Throat: Uvula is midline, oropharynx is clear and moist and mucous membranes are normal.  Eyes: EOM are normal.  Neck: Normal range of motion. Neck supple.  Cardiovascular: Normal rate and regular rhythm.   Pulmonary/Chest: Effort normal and breath sounds normal. No stridor. No respiratory distress. She has no wheezes. She has no rales.  Occasional dry cough during exam. No respiratory distress.   Musculoskeletal: Normal range of motion.  Lymphadenopathy:    She has cervical adenopathy (Left side).  Neurological: She is alert and oriented to person, place, and time.  Skin: Skin is warm and dry. She is not diaphoretic.  Psychiatric: She has a normal mood and affect. Her behavior is normal.  Nursing note and vitals reviewed.   Urgent Care Course     Procedures (including critical care time)  Labs Review Labs Reviewed - No data to display  Imaging Review Dg Chest 2 View  Result Date: 01/21/2017 CLINICAL DATA:  56 year old female with persistent nonproductive cough. Smoker. EXAM: CHEST  2 VIEW COMPARISON:  No prior chest x-rays. FINDINGS: Postoperative changes to the right axilla and chest wall. Large lung volumes, mild to moderate hyperinflation. Cardiac size is at the upper limits of normal. Other mediastinal contours are within normal limits. Visualized tracheal air column is within normal limits. No pneumothorax, pulmonary edema, pleural effusion or confluent pulmonary opacity. No acute osseous abnormality identified. Cholecystectomy clips in the right upper quadrant. Negative visible bowel gas pattern. IMPRESSION: 1. Pulmonary hyperinflation. No acute pulmonary abnormality identified. 2. Borderline to mild cardiomegaly. Electronically Signed   By: Genevie Ann M.D.   On: 01/21/2017 17:08      MDM   1. Persistent cough for 3 weeks or longer   2. Hyperinflation of lungs   3. Lymphadenopathy of left cervical region   4. Current smoker    Pt c/o chronic minimally productive cough with clear sputum.  She has been on a Zpak within the last 6 months w/o relief.   Hx of cigarette smoking.  CXR: pulmonary hyperinflation  Symptoms likely due to COPD  On exam, Left side cervical lymphadenopathy also noted. Rx: Albuterol inhaler and prednisone  Encouraged f/u with PCP for further evaluation and treatment of chronic cough as well as further evaluation of cervical lymphadenopathy.  Home care instructions provided.     Noe Gens, [place] 01/21/17 [phone]

## 2017-01-31 ENCOUNTER — Encounter: Payer: Self-pay | Admitting: Family Medicine

## 2017-01-31 ENCOUNTER — Ambulatory Visit (INDEPENDENT_AMBULATORY_CARE_PROVIDER_SITE_OTHER): Payer: 59 | Admitting: Family Medicine

## 2017-01-31 VITALS — BP 130/80 | HR 71 | Temp 98.4°F | Wt 176.0 lb

## 2017-01-31 DIAGNOSIS — K219 Gastro-esophageal reflux disease without esophagitis: Secondary | ICD-10-CM

## 2017-01-31 DIAGNOSIS — Z87891 Personal history of nicotine dependence: Secondary | ICD-10-CM

## 2017-01-31 DIAGNOSIS — R05 Cough: Secondary | ICD-10-CM

## 2017-01-31 DIAGNOSIS — R053 Chronic cough: Secondary | ICD-10-CM

## 2017-01-31 NOTE — Progress Notes (Signed)
Subjective:     [name], female   DOB: 05/02/1961, 56 y.o.   MRN: [id number]  HPI Patient seen for urgent care follow-up. She has long history of smoking and quit for about 6 years but then relapsed several months ago. Back in December she developed some upper respiratory symptoms and was treated with Zithromax but did not improve much. She had several months of coughing and finally went to urgent care July 1. Chest x-ray showed some hyperinflation lungs but no infiltrate. She had some noted wheezing was prescribed prednisone and started Zantac and symptoms improved. She has essentially no cough at this time and no wheezing. She had quit smoking again soon.   Has not had any pulmonary function testing. No hemoptysis. No appetite or weight changes. Still has some sinus congestive symptoms.  No facial pain or headaches. No fever.  There was mention of some left anterior cervical adenopathy and patient states this is improved and she has nodes that seem to come and go intermittently. She has had some recent recurrent left ear pain but no drainage.  Past Medical History:  Diagnosis Date  . ALLERGIC RHINITIS 10/08/2009  . 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  ER posivtive     . COLONIC POLYPS 10/08/2009  . Edema 10/08/2009  . Graves disease   . HYPERTHYROIDISM 11/03/2009  . Microscopic hematuria 11/01/2009  . NEOPLASM, MALIGNANT, BREAST, HX OF 10/08/2009  . THYROID FUNCTION TEST, ABNORMAL 11/01/2009   Past Surgical History:  Procedure Laterality Date  . BREAST SURGERY     breast CA, 6 weeks radiation  . CHOLECYSTECTOMY    . labrum tear Right 2/12    reports that she has been smoking Cigarettes.  She has a 12.50 pack-year smoking history. She has never used smokeless tobacco. She reports that she drinks alcohol. She reports that she does not use drugs. family history includes Alcohol abuse in her brother, father, and sister;  Arthritis (age of onset: 89) in her mother; Hypertension in her father. Allergies  Allergen Reactions  . Methimazole Hives    hives  . Sulfa Antibiotics     hives     Review of Systems  Constitutional: Negative for appetite change, chills, fever and unexpected weight change.  HENT: Positive for congestion. Negative for sore throat.   Respiratory: Negative for shortness of breath and wheezing.   Cardiovascular: Negative for chest pain.       Objective:   Physical Exam  Constitutional: She appears well-developed and well-nourished.  HENT:  Right Ear: External ear normal.  Left Ear: External ear normal.  Mouth/Throat: Oropharynx is clear and moist.  Neck: Neck supple. No thyromegaly present.  Cardiovascular: Normal rate and regular rhythm.   Pulmonary/Chest: Effort normal and breath sounds normal. No respiratory distress. She has no wheezes. She has no rales.  Lymphadenopathy:    She has no cervical adenopathy.       Assessment:     #1 recent prolonged cough with reactive airway changes which improved with prednisone. She had some hyperinflation which suggests she may have some early COPD  #2 long-standing history of nicotine use  #3 recent left anterior cervical adenopathy which appears to resolved  #4 chronic cough. Consider silent GERD    Plan:     -Get back on Prilosec over-the-counter daily for the next 1-2 months and then consider transitioning to Zantac -Discontinue smoking -Follow-up for any recurrent cough or wheezing -Consider checking pulmonary  function tests for any recurrent wheezing -Set up complete physical  Eulas Post MD [facility]

## 2017-01-31 NOTE — Patient Instructions (Signed)
Consider OTC Prilosec for at least one month- then consider transition to Zantac or Pepcid Stop smoking! Let me know if you have any recurrent cough or wheezing.

## 2017-02-05 ENCOUNTER — Ambulatory Visit: Payer: 59 | Admitting: Family Medicine

## 2017-08-22 ENCOUNTER — Encounter: Payer: Self-pay | Admitting: Family Medicine

## 2017-08-22 ENCOUNTER — Ambulatory Visit: Payer: 59 | Admitting: Family Medicine

## 2017-08-22 VITALS — BP 110/70 | HR 71 | Temp 98.2°F

## 2017-08-22 DIAGNOSIS — S5011XA Contusion of right forearm, initial encounter: Secondary | ICD-10-CM

## 2017-08-22 DIAGNOSIS — M2550 Pain in unspecified joint: Secondary | ICD-10-CM | POA: Diagnosis not present

## 2017-08-22 MED ORDER — MELOXICAM 15 MG PO TABS: 15.0000 mg | ORAL_TABLET | Freq: Every day | ORAL | 3 refills | Status: DC

## 2017-08-22 NOTE — Progress Notes (Signed)
Subjective:     [name], female   DOB: November 19, 1960, 57 y.o.   MRN: [id number]  HPI Patient seen for the following issues  She has a "lump "right forearm about midway volar surface. She states several weeks ago she fell and struck her arm against an object and had some bruising. After bruising subsided she noted the lump. Only minimally tender. Not growing in size.  Patient requesting rheumatology referral. She states she has strong family history of rheumatoid arthritis in mother and sister. She has history of what sounds like guttate psoriasis. No recent new skin rashes. She complains of arthritis in several joints but mostly DIP and PIP joints of hands. Denies involvement of the MCP joints.  Occasionally has some hip pains and foot pains. Has never seen any objective evidence for inflammation such as redness, warmth, or swelling. She's taken a couple of non-steroidals in the past without much improvement. She had some lab work several years ago screening for inflammatory markers and these were all negative  Past Medical History:  Diagnosis Date  . ALLERGIC RHINITIS 10/08/2009  . 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  ER posivtive     . COLONIC POLYPS 10/08/2009  . Edema 10/08/2009  . Graves disease   . HYPERTHYROIDISM 11/03/2009  . Microscopic hematuria 11/01/2009  . NEOPLASM, MALIGNANT, BREAST, HX OF 10/08/2009  . THYROID FUNCTION TEST, ABNORMAL 11/01/2009   Past Surgical History:  Procedure Laterality Date  . BREAST SURGERY     breast CA, 6 weeks radiation  . CHOLECYSTECTOMY    . labrum tear Right 2/12    reports that she has been smoking cigarettes.  She has a 12.50 pack-year smoking history. she has never used smokeless tobacco. She reports that she drinks alcohol. She reports that she does not use drugs. family history includes Alcohol abuse in her brother, father, and sister; Arthritis (age of onset: 56) in her mother;  Hypertension in her father. Allergies  Allergen Reactions  . Methimazole Hives    hives  . Sulfa Antibiotics     hives     Review of Systems  Constitutional: Negative for fatigue.  Eyes: Negative for visual disturbance.  Respiratory: Negative for cough, chest tightness, shortness of breath and wheezing.   Cardiovascular: Negative for chest pain, palpitations and leg swelling.  Gastrointestinal: Negative for abdominal pain.  Musculoskeletal: Positive for arthralgias. Negative for joint swelling, neck pain and neck stiffness.  Neurological: Negative for dizziness, seizures, syncope, weakness, light-headedness and headaches.  Hematological: Negative for adenopathy. Does not bruise/bleed easily.       Objective:   Physical Exam  Constitutional: She appears well-developed and well-nourished.  Cardiovascular: Normal rate and regular rhythm.  Pulmonary/Chest: Effort normal and breath sounds normal. No respiratory distress. She has no wheezes. She has no rales.  Musculoskeletal: She exhibits no edema.  She has some mild nodular changes DIP and PIP joints of both hands. Does not have any joint erythema or warmth or visible swelling otherwise  Skin:  Right forearm about midway of the lower surface she has about a 2 cm slightly firm minimally tender mass consistent with likely hematoma       Assessment:     #1 small hematoma right forearm. This follows recent injury with some bruising and fits with her history. Minimally symptomatic  #2 general arthralgias. 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 Strong family history of rheumatoid arthritis    Plan:     -reassurance  regarding right forearm hematoma. She'll try some moist heat and explained this should resolve over the next few months. Follow-up for any increased size or if this is not resolving over the next few months -Patient requesting rheumatology referral-specifically Dr. Posey Pronto in [place], [place].  We will set up -Trial of  meloxicam 15 mg once daily and take with food and cautioned about potential GI side effects such as ulcer  Eulas Post MD [facility]

## 2017-08-22 NOTE — Patient Instructions (Signed)

## 2017-12-27 ENCOUNTER — Other Ambulatory Visit: Payer: Self-pay | Admitting: Family Medicine

## 2017-12-27 NOTE — Telephone Encounter (Signed)
Rx done. 

## 2017-12-27 NOTE — Telephone Encounter (Signed)
Refill OK

## 2018-02-01 ENCOUNTER — Other Ambulatory Visit: Payer: Self-pay | Admitting: Family Medicine

## 2018-03-25 ENCOUNTER — Other Ambulatory Visit: Payer: Self-pay | Admitting: Family Medicine

## 2018-03-26 NOTE — Telephone Encounter (Signed)
Refill once OK. 

## 2018-03-26 NOTE — Telephone Encounter (Signed)
Given at office visit 08/22/17 -Trial of meloxicam 15 mg once daily and take with food and cautioned about potential GI side effects such as ulcer  Okay to refill?

## 2018-04-04 ENCOUNTER — Telehealth: Payer: Self-pay | Admitting: *Deleted

## 2018-04-04 ENCOUNTER — Telehealth: Payer: Self-pay | Admitting: Family Medicine

## 2018-04-04 ENCOUNTER — Encounter: Payer: Self-pay | Admitting: Family Medicine

## 2018-04-04 ENCOUNTER — Ambulatory Visit: Payer: 59 | Admitting: Family Medicine

## 2018-04-04 VITALS — BP 100/62 | HR 77 | Temp 99.0°F | Ht 66.5 in | Wt 186.9 lb

## 2018-04-04 DIAGNOSIS — R234 Changes in skin texture: Secondary | ICD-10-CM

## 2018-04-04 DIAGNOSIS — Z853 Personal history of malignant neoplasm of breast: Secondary | ICD-10-CM | POA: Diagnosis not present

## 2018-04-04 DIAGNOSIS — N63 Unspecified lump in unspecified breast: Secondary | ICD-10-CM | POA: Diagnosis not present

## 2018-04-04 NOTE — Telephone Encounter (Signed)
Patient returned call. Scheduled for 1:30 today with Dr. Maudie Mercury. Patient is wanting to have MMG completed at [facility] today.

## 2018-04-04 NOTE — Telephone Encounter (Signed)
Pt notified of results/instructions and verbalized understanding. She is going to try to rearrange her work schedule and come in for 1:30 appt w/ Dr. Maudie Mercury today. She will call right back, appt time held for patient.

## 2018-04-04 NOTE — Telephone Encounter (Signed)
Dr. Maudie Mercury, can you advise in PCP absence? Ok for bilat diagnostic MMG or should patient schedule appointment first?

## 2018-04-04 NOTE — Patient Instructions (Signed)
-  We placed a referral for you as discussed to the breast center. It usually takes about 1 week to process and schedule this referral, but we are calling to see if we can get you in sooner. If you have not heard from the breast center in the next several days, please contact our office.

## 2018-04-04 NOTE — Progress Notes (Signed)
HPI:  Using dictation device. Unfortunately this device frequently misinterprets words/phrases.  Acute visit for breast lump: -R upper outer quadrant, she noticed today -reports has always had some density in breasts around prior radiation/surgical site (remote hx breast ca) -however, when did self breast exam today if felt a little different to her - feels small lump near lateral edge of the scar -no fevers, malaise, drainage -has small skin lesion here and noticed 2 small skin lesions near areola as well - has hx psoriasis -wishes to be seen at breast center today for evaluation  ROS: See pertinent positives and negatives per HPI.  Past Medical History:  Diagnosis Date  . ALLERGIC RHINITIS 10/08/2009  . Cancer ([place]) 1996   infiltrating ductal, lumpectomy with axillary node dissection.  ER posivtive     . COLONIC POLYPS 10/08/2009  . Edema 10/08/2009  . Graves disease   . HYPERTHYROIDISM 11/03/2009  . Microscopic hematuria 11/01/2009  . NEOPLASM, MALIGNANT, BREAST, HX OF 10/08/2009  . THYROID FUNCTION TEST, ABNORMAL 11/01/2009    Past Surgical History:  Procedure Laterality Date  . BREAST SURGERY     breast CA, 6 weeks radiation  . CHOLECYSTECTOMY    . labrum tear Right 2/12    Family History  Problem Relation Age of Onset  . Arthritis Mother 27       RA  . Alcohol abuse Father   . Hypertension Father   . Alcohol abuse Sister   . Alcohol abuse Brother     SOCIAL HX: see hpi   Current Outpatient Medications:  .  albuterol (PROVENTIL HFA;VENTOLIN HFA) 108 (90 Base) MCG/ACT inhaler, Inhale 1-2 puffs into the lungs every 6 (six) hours as needed for wheezing or shortness of breath., Disp: 1 Inhaler, Rfl: 0 .  cetirizine (ZYRTEC) 10 MG tablet, Take 1 tablet (10 mg total) by mouth daily. For at least 2 weeks, Disp: 30 tablet, Rfl: 1 .  doxycycline (VIBRA-TABS) 100 MG tablet, , Disp: , Rfl:  .  levothyroxine (SYNTHROID, LEVOTHROID) 137 MCG tablet, Take 137 mcg by mouth  daily., Disp: , Rfl:  .  linaclotide (LINZESS) 290 MCG CAPS capsule, Take by mouth., Disp: , Rfl:  .  meloxicam (MOBIC) 15 MG tablet, TAKE 1 TABLET BY MOUTH EVERY DAY, Disp: 30 tablet, Rfl: 0  EXAM:  Vitals:   04/04/18 1325  BP: 100/62  Pulse: 77  Temp: 99 F (37.2 C)    Body mass index is 29.71 kg/m.  GENERAL: vitals reviewed and listed above, alert, oriented, appears well hydrated and in no acute distress  HEENT: atraumatic, conjunttiva clear, no obvious abnormalities on inspection of external nose and ears  NECK: no obvious masses on inspection  BREAST: surgical scar R upper quadrant breast, small erythematous papule just lateral to this, 2 linear curved lesions with appearance of excoriations near areola, some density to the breast tissue in the area of the scar and inferolateral to the scar  PSYCH: pleasant and cooperative, no obvious depression or anxiety  ASSESSMENT AND PLAN:  Discussed the following assessment and plan:  Breast lump  Breast skin changes  HX: breast cancer  -urgent referral to breast center for evaluation placed, advised assistant to contact breast center with patient's requestions -Patient advised to return or notify a doctor immediately if symptoms worsen or new concerns arise prior to evaluation. Advise expect evaluation in the next 1 week, hopefully sooner pending breast center availability.  There are no Patient Instructions on file for this visit.  Jarrett Soho  Vista Lawman, DO

## 2018-04-04 NOTE — Telephone Encounter (Signed)
They usually want eval by doctor for proper orders. I'll work her in today. We can see her today for eval and order - please see if she can come in to see me today.

## 2018-04-04 NOTE — Telephone Encounter (Signed)
Per Dr Maudie Mercury I called [facility] to schedule an appt for a diagnostic mammo and ultrasound of the right breast and spoke with Janett Billow.  Orders for a right diagnostic mammo and [place] due to right upper outer increased density around surgical scar, skin lesion, small red papule near scar and history of breast cancer faxed to [phone].  Per Janett Billow the pt can come in now to be worked in for testing and the pt was informed.

## 2018-04-04 NOTE — Telephone Encounter (Signed)
Copied from [place] [id number]. Topic: General - Other >> Apr 04, 2018  8:10 AM Alicia Mcconnell, NT wrote: Reason for CRM: Patient called and would needs a order for a diagnostic mammogram as soon as possible she has a history of breast cancer and she has found a lump last night  the lump is near her  arm pit , above her   old incision , [facility] is unable to schedule without the order .she is very upset and would like  a call  back  at [phone]

## 2018-04-23 ENCOUNTER — Other Ambulatory Visit: Payer: Self-pay | Admitting: Family Medicine

## 2018-04-23 NOTE — Telephone Encounter (Signed)
Last OV 04/04/18 with Dr. Maudie Mercury, No future OV  Last filled 03/26/18, # 30 with 0 refills  Okay to continue to refill?

## 2018-04-23 NOTE — Telephone Encounter (Signed)
Refill once OK. 

## 2018-06-18 ENCOUNTER — Ambulatory Visit: Payer: 59 | Admitting: Family Medicine

## 2018-06-18 ENCOUNTER — Encounter: Payer: Self-pay | Admitting: Family Medicine

## 2018-06-18 VITALS — BP 102/70 | HR 67 | Temp 98.2°F | Wt 179.8 lb

## 2018-06-18 DIAGNOSIS — J019 Acute sinusitis, unspecified: Secondary | ICD-10-CM | POA: Diagnosis not present

## 2018-06-18 DIAGNOSIS — J029 Acute pharyngitis, unspecified: Secondary | ICD-10-CM | POA: Diagnosis not present

## 2018-06-18 MED ORDER — AMOXICILLIN-POT CLAVULANATE 875-125 MG PO TABS: 1.0000 | ORAL_TABLET | Freq: Two times a day (BID) | ORAL | 0 refills | Status: DC

## 2018-06-18 NOTE — Patient Instructions (Signed)

## 2018-06-18 NOTE — Progress Notes (Signed)
  Subjective:     [name], female   DOB: 11/04/1960, 57 y.o.   MRN: [id number]  HPI Patient is seen with 3-week history of sore throat.  She has had some cough over the past several days as well.  She has had greenish nasal discharge for 3 weeks.  Intermittent facial pain.  Occasional headaches.  No definite fever.  Increased malaise.  She has tried nasal saline irrigation without much improvement.  Past Medical History:  Diagnosis Date  . ALLERGIC RHINITIS 10/08/2009  . Cancer ([place]) 1996   infiltrating ductal, lumpectomy with axillary node dissection.  ER posivtive     . COLONIC POLYPS 10/08/2009  . Edema 10/08/2009  . Graves disease   . HYPERTHYROIDISM 11/03/2009  . Microscopic hematuria 11/01/2009  . NEOPLASM, MALIGNANT, BREAST, HX OF 10/08/2009  . THYROID FUNCTION TEST, ABNORMAL 11/01/2009   Past Surgical History:  Procedure Laterality Date  . BREAST SURGERY     breast CA, 6 weeks radiation  . CHOLECYSTECTOMY    . labrum tear Right 2/12    reports that she has been smoking cigarettes. She has a 12.50 pack-year smoking history. She has never used smokeless tobacco. She reports that she drinks alcohol. She reports that she does not use drugs. family history includes Alcohol abuse in her brother, father, and sister; Arthritis (age of onset: 72) in her mother; Hypertension in her father. Allergies  Allergen Reactions  . Methimazole Hives    hives  . Sulfa Antibiotics     hives     Review of Systems  Constitutional: Negative for chills, fever and unexpected weight change.  HENT: Positive for congestion, sinus pressure, sinus pain and sore throat.   Respiratory: Positive for cough.        Objective:   Physical Exam  Constitutional: She appears well-developed and well-nourished.  HENT:  Right Ear: Tympanic membrane normal.  Left Ear: Tympanic membrane normal.  Oropharynx reveals pink mucosa.  No exudate.  Neck: Neck supple.  Cardiovascular: Normal rate  and regular rhythm.  Pulmonary/Chest: Effort normal and breath sounds normal. She has no rales.  Lymphadenopathy:    She has no cervical adenopathy.       Assessment:     Sore throat.  Suspect secondary to postnasal drainage from acute sinusitis    Plan:     -Given duration of symptoms will start Augmentin 875 mg twice daily with food for 10 days -Continue nasal saline irrigation -Follow-up as needed for any persistent or worsening symptoms  Eulas Post MD [facility]

## 2018-10-15 ENCOUNTER — Other Ambulatory Visit: Payer: Self-pay

## 2018-10-15 ENCOUNTER — Ambulatory Visit (INDEPENDENT_AMBULATORY_CARE_PROVIDER_SITE_OTHER): Payer: 59 | Admitting: Family Medicine

## 2018-10-15 DIAGNOSIS — M533 Sacrococcygeal disorders, not elsewhere classified: Secondary | ICD-10-CM | POA: Diagnosis not present

## 2018-10-15 MED ORDER — CYCLOBENZAPRINE HCL 5 MG PO TABS: 5.0000 mg | ORAL_TABLET | Freq: Three times a day (TID) | ORAL | 1 refills | Status: DC | PRN

## 2018-10-15 MED ORDER — PREDNISONE 10 MG PO TABS: ORAL_TABLET | ORAL | 0 refills | Status: DC

## 2018-10-15 NOTE — Progress Notes (Signed)
[name], female   DOB: 1961-07-20, 58 y.o.   MRN: [id number]  Virtual Visit via Video Note  I connected with Alicia Mcconnell on 10/15/18 at  3:00 PM EDT by a video enabled telemedicine application and verified that I am speaking with the correct person using two identifiers.  Location patient: home Location provider:work or home office Persons participating in the virtual visit: patient, provider  I discussed the limitations of evaluation and management by telemedicine and the availability of in person appointments. The patient expressed understanding and agreed to proceed.   HPI: Patient complains of acute onset earlier today of pain in her left buttock region and around the sacroiliac region.  She is being evaluated for possible Humira use for psoriatic arthritis.  She states she was doing a lunge and felt a sharp sudden severe pain around her less SI joint region.  She had difficulty getting up.  Denies any classic sciatica type symptoms.  She took Mobic which she had leftover and she also had 10 mg of prednisone that she took earlier today.  She tried some heat which provided some temporary relief.  Pain is slightly improved at this point.  She is not noted any visible swelling or bruising.  Ambulating without difficulty.   ROS: See pertinent positives and negatives per HPI.  Past Medical History:  Diagnosis Date  . ALLERGIC RHINITIS 10/08/2009  . Cancer ([place]) 1996   infiltrating ductal, lumpectomy with axillary node dissection.  ER posivtive     . COLONIC POLYPS 10/08/2009  . Edema 10/08/2009  . Graves disease   . HYPERTHYROIDISM 11/03/2009  . Microscopic hematuria 11/01/2009  . NEOPLASM, MALIGNANT, BREAST, HX OF 10/08/2009  . THYROID FUNCTION TEST, ABNORMAL 11/01/2009    Past Surgical History:  Procedure Laterality Date  . BREAST SURGERY     breast CA, 6 weeks radiation  . CHOLECYSTECTOMY    . labrum tear Right 2/12    Family History  Problem Relation Age  of Onset  . Arthritis Mother 41       RA  . Alcohol abuse Father   . Hypertension Father   . Alcohol abuse Sister   . Alcohol abuse Brother     SOCIAL HX: ex-smoker.  No regular use of ETOH   Current Outpatient Medications:  .  albuterol (PROVENTIL HFA;VENTOLIN HFA) 108 (90 Base) MCG/ACT inhaler, Inhale 1-2 puffs into the lungs every 6 (six) hours as needed for wheezing or shortness of breath., Disp: 1 Inhaler, Rfl: 0 .  amoxicillin-clavulanate (AUGMENTIN) 875-125 MG tablet, Take 1 tablet by mouth 2 (two) times daily., Disp: 20 tablet, Rfl: 0 .  cetirizine (ZYRTEC) 10 MG tablet, Take 1 tablet (10 mg total) by mouth daily. For at least 2 weeks, Disp: 30 tablet, Rfl: 1 .  cyclobenzaprine (FLEXERIL) 5 MG tablet, Take 1 tablet (5 mg total) by mouth 3 (three) times daily as needed for muscle spasms., Disp: 40 tablet, Rfl: 1 .  levothyroxine (SYNTHROID, LEVOTHROID) 137 MCG tablet, Take 137 mcg by mouth daily., Disp: , Rfl:  .  linaclotide (LINZESS) 290 MCG CAPS capsule, Take by mouth., Disp: , Rfl:  .  meloxicam (MOBIC) 15 MG tablet, TAKE 1 TABLET BY MOUTH EVERY DAY, Disp: 30 tablet, Rfl: 0 .  predniSONE (DELTASONE) 10 MG tablet, Taper as follows: 4-4-4-4-3-3-2-2-1-1, Disp: 28 tablet, Rfl: 0  EXAM:  VITALS per patient if applicable:  GENERAL: alert, oriented, appears well and in no acute distress  HEENT: atraumatic, conjunttiva clear, no  obvious abnormalities on inspection of external nose and ears  NECK: normal movements of the head and neck  LUNGS: on inspection no signs of respiratory distress, breathing rate appears normal, no obvious gross SOB, gasping or wheezing  CV: no obvious cyanosis  MS: moves all visible extremities without noticeable abnormality  PSYCH/NEURO: pleasant and cooperative, no obvious depression or anxiety, speech and thought processing grossly intact  ASSESSMENT AND PLAN:  Discussed the following assessment and plan:  Left buttock and SI joint region  pain following change of position earlier today.  -We recommended prednisone taper starting at 40 mg daily and take with food -Avoid concomitant use of Mobic with the prednisone -Flexeril 5 mg 1-2 every 8 hours as needed for muscle spasm -Touch base if not improving over the next week -Continue heat or ice for symptomatic relief     I discussed the assessment and treatment plan with the patient. The patient was provided an opportunity to ask questions and all were answered. The patient agreed with the plan and demonstrated an understanding of the instructions.   The patient was advised to call back or seek an in-person evaluation if the symptoms worsen or if the condition fails to improve as anticipated.  I provided 16 minutes of non-face-to-face time during this encounter.   Carolann Littler, MD

## 2018-10-16 ENCOUNTER — Ambulatory Visit (INDEPENDENT_AMBULATORY_CARE_PROVIDER_SITE_OTHER)
Admission: RE | Admit: 2018-10-16 | Discharge: 2018-10-16 | Disposition: A | Discharge status: Home / Self Care | Payer: 59 | Source: Ambulatory Visit | Attending: Family Medicine | Admitting: Family Medicine

## 2018-10-16 ENCOUNTER — Ambulatory Visit: Payer: 59 | Admitting: Family Medicine

## 2018-10-16 ENCOUNTER — Other Ambulatory Visit: Payer: Self-pay

## 2018-10-16 ENCOUNTER — Encounter: Payer: Self-pay | Admitting: Family Medicine

## 2018-10-16 ENCOUNTER — Telehealth: Payer: Self-pay

## 2018-10-16 VITALS — HR 82 | Ht 66.5 in | Wt 186.0 lb

## 2018-10-16 DIAGNOSIS — M545 Low back pain, unspecified: Secondary | ICD-10-CM

## 2018-10-16 DIAGNOSIS — M62830 Muscle spasm of back: Secondary | ICD-10-CM | POA: Diagnosis not present

## 2018-10-16 DIAGNOSIS — M549 Dorsalgia, unspecified: Secondary | ICD-10-CM

## 2018-10-16 MED ORDER — TIZANIDINE HCL 4 MG PO TABS: 4.0000 mg | ORAL_TABLET | Freq: Every evening | ORAL | 2 refills | Status: DC

## 2018-10-16 MED ORDER — GABAPENTIN 100 MG PO CAPS: 200.0000 mg | ORAL_CAPSULE | Freq: Every day | ORAL | 3 refills | Status: DC

## 2018-10-16 MED ORDER — KETOROLAC TROMETHAMINE 60 MG/2ML IM SOLN
60.0000 mg | Freq: Once | INTRAMUSCULAR | Status: AC
  Administered 2018-10-16: 60 mg via INTRAMUSCULAR

## 2018-10-16 MED ORDER — METHYLPREDNISOLONE ACETATE 80 MG/ML IJ SUSP
80.0000 mg | Freq: Once | INTRAMUSCULAR | Status: AC
  Administered 2018-10-16: 80 mg via INTRAMUSCULAR

## 2018-10-16 NOTE — Telephone Encounter (Signed)
Referral for sports medicine has been placed. Called patient and she is aware and verbalized an understanding. Sent to [employer] as urgent.

## 2018-10-16 NOTE — Progress Notes (Signed)
[facility] [address], [place] [address] Phone: [phone] Subjective:    I'm seeing this patient by the request  of:  Eulas Post, MD   CC: Low back pain  GHW:[id number]  Alicia Mcconnell is a 58 y.o. female coming in with complaint of low back pain. Patient was having soreness in lower back recently. Yesterday she noted some pain in left leg when walking. She performed a right sided lunge trying to improve her pain yesterday when she developed sharp pain in the left SI. Patient was in so much pain she almost passed out. Denies any radiating symptoms. No history of back pain. Pain is constant and still sharp. Tried prednisone yesterday and took flexeril last night.  Patient denies any radiation down the leg and does not remember any true trauma.  Patient rates the severity of pain though is 9 out of 10 in certain movements     Past Medical History:  Diagnosis Date  . ALLERGIC RHINITIS 10/08/2009  . Cancer ([place]) 1996   infiltrating ductal, lumpectomy with axillary node dissection.  ER posivtive     . COLONIC POLYPS 10/08/2009  . Edema 10/08/2009  . Graves disease   . HYPERTHYROIDISM 11/03/2009  . Microscopic hematuria 11/01/2009  . NEOPLASM, MALIGNANT, BREAST, HX OF 10/08/2009  . THYROID FUNCTION TEST, ABNORMAL 11/01/2009   Past Surgical History:  Procedure Laterality Date  . BREAST SURGERY     breast CA, 6 weeks radiation  . CHOLECYSTECTOMY    . labrum tear Right 2/12   Social History   Socioeconomic History  . Marital status: Divorced    Spouse name: Not on file  . Number of children: Not on file  . Years of education: Not on file  . Highest education level: Not on file  Occupational History  . Not on file  Social Needs  . Financial resource strain: Not on file  . Food insecurity:    Worry: Not on file    Inability: Not on file  . Transportation needs:    Medical: Not on file    Non-medical: Not on file  Tobacco Use   . Smoking status: Current Every Day Smoker    Packs/day: 0.50    Years: 25.00    Pack years: 12.50    Types: Cigarettes    Last attempt to quit: 09/13/2010    Years since quitting: 8.0  . Smokeless tobacco: Never Used  Substance and Sexual Activity  . Alcohol use: Yes    Alcohol/week: 0.0 standard drinks    Comment: rare  . Drug use: No  . Sexual activity: Not on file  Lifestyle  . Physical activity:    Days per week: Not on file    Minutes per session: Not on file  . Stress: Not on file  Relationships  . Social connections:    Talks on phone: Not on file    Gets together: Not on file    Attends religious service: Not on file    Active member of club or organization: Not on file    Attends meetings of clubs or organizations: Not on file    Relationship status: Not on file  Other Topics Concern  . Not on file  Social History Narrative  . Not on file   Allergies  Allergen Reactions  . Methimazole Hives    hives  . Sulfa Antibiotics     hives   Family History  Problem Relation Age of  Onset  . Arthritis Mother 63       RA  . Alcohol abuse Father   . Hypertension Father   . Alcohol abuse Sister   . Alcohol abuse Brother     Current Outpatient Medications (Endocrine & Metabolic):  .  levothyroxine (SYNTHROID, LEVOTHROID) 137 MCG tablet, Take 137 mcg by mouth daily. .  predniSONE (DELTASONE) 10 MG tablet, Taper as follows: 4-4-4-4-3-3-2-2-1-1   Current Outpatient Medications (Respiratory):  .  albuterol (PROVENTIL HFA;VENTOLIN HFA) 108 (90 Base) MCG/ACT inhaler, Inhale 1-2 puffs into the lungs every 6 (six) hours as needed for wheezing or shortness of breath. .  cetirizine (ZYRTEC) 10 MG tablet, Take 1 tablet (10 mg total) by mouth daily. For at least 2 weeks  Current Outpatient Medications (Analgesics):  .  meloxicam (MOBIC) 15 MG tablet, TAKE 1 TABLET BY MOUTH EVERY DAY   Current Outpatient Medications (Other):  .  amoxicillin-clavulanate (AUGMENTIN) 875-125  MG tablet, Take 1 tablet by mouth 2 (two) times daily. .  cyclobenzaprine (FLEXERIL) 5 MG tablet, Take 1 tablet (5 mg total) by mouth 3 (three) times daily as needed for muscle spasms. Marland Kitchen  linaclotide (LINZESS) 290 MCG CAPS capsule, Take by mouth. .  gabapentin (NEURONTIN) 100 MG capsule, Take 2 capsules (200 mg total) by mouth at bedtime. Marland Kitchen  tiZANidine (ZANAFLEX) 4 MG tablet, Take 1 tablet (4 mg total) by mouth Nightly for 10 days.    Past medical history, social, surgical and family history all reviewed in electronic medical record.  No pertanent information unless stated regarding to the chief complaint.   Review of Systems:  No headache, visual changes, nausea, vomiting, diarrhea, constipation, dizziness, abdominal pain, skin rash, fevers, chills, night sweats, weight loss, swollen lymph nodes, body aches, joint swelling, muscle aches, chest pain, shortness of breath, mood changes.   Objective  Pulse 82, height 5' 6.5" (1.689 m), weight 186 lb (84.4 kg), SpO2 99 %.    General: No apparent distress alert and oriented x3 mood and affect normal, dressed appropriately.  HEENT: Pupils equal, extraocular movements intact  Respiratory: Patient's speak in full sentences and does not appear short of breath  Cardiovascular: No lower extremity edema, non tender, no erythema  Skin: Warm dry intact with no signs of infection or rash on extremities or on axial skeleton.  Abdomen: Soft nontender  Neuro: Cranial nerves II through XII are intact, neurovascularly intact in all extremities with 2+ DTRs and 2+ pulses.  Lymph: No lymphadenopathy of posterior or anterior cervical chain or axillae bilaterally.  Gait normal with good balance and coordination.  MSK:  Non tender with full range of motion and good stability and symmetric strength and tone of shoulders, elbows, wrist, hip, knee and ankles bilaterally.  Back exam patient is very uncomfortable.  Unable to flex greater than 10 degrees without  significant amount of pain on the left side of the paraspinal musculature of the lumbar spine.  Mostly around L4-L5.  No masses appreciated but significant muscle spasm noted.  Negative straight leg test.  Unable to do Corky Sox secondary to discomfort.  Neurovascular intact distally with 5 out of 5 strength.  Deep tendon reflexes appear to be intact  [id number]; 15 additional minutes spent for Therapeutic exercises as stated in above notes.  This included exercises focusing on stretching, strengthening, with significant focus on eccentric aspects.   Long term goals include an improvement in range of motion, strength, endurance as well as avoiding reinjury. Patient's frequency would include in 1-2 times  a day, 3-5 times a week for a duration of 6-12 weeks. Low back exercises that included:  Pelvic tilt/bracing instruction to focus on control of the pelvic girdle and lower abdominal muscles  Glute strengthening exercises, focusing on proper firing of the glutes without engaging the low back muscles Proper stretching techniques for maximum relief for the hamstrings, hip flexors, low back and some rotation where tolerated   Proper technique shown and discussed handout in great detail with ATC.  All questions were discussed and answered.      Impression and Recommendations:     This case required medical decision making of moderate complexity. The above documentation has been reviewed and is accurate and complete Lyndal Pulley, DO       Note: This dictation was prepared with Dragon dictation along with smaller phrase technology. Any transcriptional errors that result from this process are unintentional.

## 2018-10-16 NOTE — Patient Instructions (Addendum)
Good to see you  xrays downstairs  2 injections today  Ice 20 minutes 2 times daily. Usually after activity and before bed. zanaflex instead of the flexeril  Gabapentin 200mg  at night to help with any nerve pain and help with sleep  Hold on rest of the prednsoine Exercises 3 times a week.  See me again on a video visit in 1 week

## 2018-10-16 NOTE — Telephone Encounter (Signed)
Let's see if we can get her in to see Gardenia Phlegm today

## 2018-10-16 NOTE — Telephone Encounter (Signed)
Copied from [place] [id number]. Topic: General - Inquiry >> Oct 16, 2018  9:35 AM Virl Axe D wrote: Reason for CRM: Pt stated that she did a Webex appt with Dr. Elease Hashimoto yesterday and has some follow up questions/concerns she wants to discuss with him or his assistant. She would like a call back. Please advise. CB#[phone]

## 2018-10-16 NOTE — Telephone Encounter (Signed)
Called patient and she stated that she is in severe pain and she took her prednisone and flexeril last night and she slept last night but today she feels way worse. She thinks it is her back and lower area of back, she is really upset and not sure why the pain is worse and she can definitely tell it is in her back and not sure if muscle or something else.   Please advise.

## 2018-10-16 NOTE — Assessment & Plan Note (Signed)
Lumbar muscle spasm.  X-rays pending no radicular symptoms.  History of autoimmune as well as history of breast cancer, monitor for any significant other changes.  Discussed icing regimen, muscle relaxer and gabapentin given today.  Work with Product/process development scientist to learn home exercises, due to the coronavirus patient will follow-up with me with a video follow-up 1 week

## 2018-10-16 NOTE — Addendum Note (Signed)
Addended by: Douglass Rivers T on: 10/16/2018 01:33 PM   Modules accepted: Orders

## 2018-10-23 ENCOUNTER — Encounter: Payer: Self-pay | Admitting: Family Medicine

## 2018-10-23 ENCOUNTER — Ambulatory Visit (INDEPENDENT_AMBULATORY_CARE_PROVIDER_SITE_OTHER): Payer: 59 | Admitting: Family Medicine

## 2018-10-23 DIAGNOSIS — M62830 Muscle spasm of back: Secondary | ICD-10-CM | POA: Diagnosis not present

## 2018-10-23 MED ORDER — TIZANIDINE HCL 4 MG PO TABS: 4.0000 mg | ORAL_TABLET | Freq: Every evening | ORAL | 2 refills | Status: AC

## 2018-10-23 NOTE — Assessment & Plan Note (Signed)
Patient is making improvement at this time.  Discussed with patient again in great length.  Patient given phase 2 exercises, until to try to increase activity slowly.  We discussed posture and ergonomics.  Patient is to increase activity.  Follow-up again as needed.

## 2018-10-23 NOTE — Progress Notes (Signed)
[facility] [address] [place], [place] [address] Phone: [phone] Subjective:   Virtual Visit via Video Note  I connected with Alicia Mcconnell on 10/23/18 at  2:45 PM EDT by a video enabled telemedicine application and verified that I am speaking with the correct person using two identifiers.   I discussed the limitations of evaluation and management by telemedicine and the availability of in person appointments. The patient expressed understanding and agreed to proceed.     I discussed the assessment and treatment plan with the patient. The patient was provided an opportunity to ask questions and all were answered. The patient agreed with the plan and demonstrated an understanding of the instructions.   The patient was advised to call back or seek an in-person evaluation if the symptoms worsen or if the condition fails to improve as anticipated.    Alicia Pulley, DO  More detailed note below    CC: Back pain follow-up  YQI:[id number]  Alicia Mcconnell is a 58 y.o. female coming in with complaint of  Patient does have back pain.  Was seen previously and had what appeared to be a muscle spasm versus a acute tear.  Patient states that is doing significantly better.  80 to 85% better.  States that if she tries to take a long stride she notices more discomfort and pain.    Past Medical History:  Diagnosis Date  . ALLERGIC RHINITIS 10/08/2009  . Cancer ([employer]) 1996   infiltrating ductal, lumpectomy with axillary node dissection.  ER posivtive     . COLONIC POLYPS 10/08/2009  . Edema 10/08/2009  . Graves disease   . HYPERTHYROIDISM 11/03/2009  . Microscopic hematuria 11/01/2009  . NEOPLASM, MALIGNANT, BREAST, HX OF 10/08/2009  . THYROID FUNCTION TEST, ABNORMAL 11/01/2009   Past Surgical History:  Procedure Laterality Date  . BREAST SURGERY     breast CA, 6 weeks radiation  . CHOLECYSTECTOMY    . labrum tear Right 2/12   Social History    Socioeconomic History  . Marital status: Divorced    Spouse name: Not on file  . Number of children: Not on file  . Years of education: Not on file  . Highest education level: Not on file  Occupational History  . Not on file  Social Needs  . Financial resource strain: Not on file  . Food insecurity:    Worry: Not on file    Inability: Not on file  . Transportation needs:    Medical: Not on file    Non-medical: Not on file  Tobacco Use  . Smoking status: Current Every Day Smoker    Packs/day: 0.50    Years: 25.00    Pack years: 12.50    Types: Cigarettes    Last attempt to quit: 09/13/2010    Years since quitting: 8.1  . Smokeless tobacco: Never Used  Substance and Sexual Activity  . Alcohol use: Yes    Alcohol/week: 0.0 standard drinks    Comment: rare  . Drug use: No  . Sexual activity: Not on file  Lifestyle  . Physical activity:    Days per week: Not on file    Minutes per session: Not on file  . Stress: Not on file  Relationships  . Social connections:    Talks on phone: Not on file    Gets together: Not on file    Attends religious service: Not on file    Active member of  club or organization: Not on file    Attends meetings of clubs or organizations: Not on file    Relationship status: Not on file  Other Topics Concern  . Not on file  Social History Narrative  . Not on file   Allergies  Allergen Reactions  . Methimazole Hives    hives  . Sulfa Antibiotics     hives   Family History  Problem Relation Age of Onset  . Arthritis Mother 61       RA  . Alcohol abuse Father   . Hypertension Father   . Alcohol abuse Sister   . Alcohol abuse Brother     Current Outpatient Medications (Endocrine & Metabolic):  .  levothyroxine (SYNTHROID, LEVOTHROID) 137 MCG tablet, Take 137 mcg by mouth daily. .  predniSONE (DELTASONE) 10 MG tablet, Taper as follows: 4-4-4-4-3-3-2-2-1-1   Current Outpatient Medications (Respiratory):  .  albuterol (PROVENTIL  HFA;VENTOLIN HFA) 108 (90 Base) MCG/ACT inhaler, Inhale 1-2 puffs into the lungs every 6 (six) hours as needed for wheezing or shortness of breath. .  cetirizine (ZYRTEC) 10 MG tablet, Take 1 tablet (10 mg total) by mouth daily. For at least 2 weeks  Current Outpatient Medications (Analgesics):  .  meloxicam (MOBIC) 15 MG tablet, TAKE 1 TABLET BY MOUTH EVERY DAY   Current Outpatient Medications (Other):  .  amoxicillin-clavulanate (AUGMENTIN) 875-125 MG tablet, Take 1 tablet by mouth 2 (two) times daily. .  cyclobenzaprine (FLEXERIL) 5 MG tablet, Take 1 tablet (5 mg total) by mouth 3 (three) times daily as needed for muscle spasms. Marland Kitchen  gabapentin (NEURONTIN) 100 MG capsule, Take 2 capsules (200 mg total) by mouth at bedtime. Marland Kitchen  linaclotide (LINZESS) 290 MCG CAPS capsule, Take by mouth. Marland Kitchen  tiZANidine (ZANAFLEX) 4 MG tablet, Take 1 tablet (4 mg total) by mouth Nightly for 10 days.    Past medical history, social, surgical and family history all reviewed in electronic medical record.  No pertanent information unless stated regarding to the chief complaint.   Review of Systems:  No headache, visual changes, nausea, vomiting, diarrhea, constipation, dizziness, abdominal pain, skin rash, fevers, chills, night sweats, weight loss, swollen lymph nodes, body aches, joint swelling,  chest pain, shortness of breath, mood changes.  Positive muscle aches  Objective    General: No apparent distress alert and oriented x3 mood and affect normal, dressed appropriately.  Patient during the video conference was walking around her apartment just fine.    Impression and Recommendations:    . The above documentation has been reviewed and is accurate and complete Alicia Pulley, DO       Note: This dictation was prepared with Dragon dictation along with smaller phrase technology. Any transcriptional errors that result from this process are unintentional.

## 2018-11-07 ENCOUNTER — Other Ambulatory Visit: Payer: Self-pay | Admitting: Family Medicine

## 2019-03-21 ENCOUNTER — Encounter: Payer: Self-pay | Admitting: Family Medicine

## 2019-05-06 LAB — HM MAMMOGRAPHY

## 2019-06-03 ENCOUNTER — Encounter: Payer: Self-pay | Admitting: Family Medicine

## 2019-06-24 ENCOUNTER — Telehealth: Payer: Self-pay | Admitting: Family Medicine

## 2019-06-24 NOTE — Telephone Encounter (Signed)
Called patient and LMOVM to return call  [place] for [facility] to Discuss results / PCP / recommendations / Schedule patient  I left a detailed message for patient that we are keeping her and her family in our prayers and to please let us know who to address the letter for a massage to if it is her employer.  CRM Created.

## 2019-06-24 NOTE — Telephone Encounter (Signed)
Pt is calling in to request to have a letter/Rx from provider to have a massage. Pt says that her father passed away and she has a lot of tension.    CB: [phone]

## 2019-06-25 NOTE — Telephone Encounter (Signed)
Please see message. °

## 2019-06-25 NOTE — Telephone Encounter (Signed)
See note

## 2019-06-25 NOTE — Telephone Encounter (Signed)
The letter needs to be dated 11/28 and for any massage she may need for her shoulder for trigger points.    [facility] number [phone]

## 2019-06-29 ENCOUNTER — Encounter: Payer: Self-pay | Admitting: Family Medicine

## 2019-06-29 NOTE — Telephone Encounter (Signed)
Letter done.  We cannot back date any notes.

## 2019-06-30 ENCOUNTER — Telehealth (INDEPENDENT_AMBULATORY_CARE_PROVIDER_SITE_OTHER): Payer: 59 | Admitting: Family Medicine

## 2019-06-30 DIAGNOSIS — J069 Acute upper respiratory infection, unspecified: Secondary | ICD-10-CM | POA: Diagnosis not present

## 2019-06-30 DIAGNOSIS — Z20828 Contact with and (suspected) exposure to other viral communicable diseases: Secondary | ICD-10-CM | POA: Diagnosis not present

## 2019-06-30 DIAGNOSIS — Z20822 Contact with and (suspected) exposure to covid-19: Secondary | ICD-10-CM

## 2019-06-30 MED ORDER — FLUTICASONE PROPIONATE 50 MCG/ACT NA SUSP: 1.0000 | Freq: Every day | NASAL | 0 refills | Status: DC

## 2019-06-30 NOTE — Telephone Encounter (Signed)
Called patient and LMOVM to return call  [place] for [facility] to Discuss results / PCP / recommendations / Schedule patient  Per Dr. Elease Hashimoto: Letter done.  We cannot back date any notes.    CRM Created

## 2019-06-30 NOTE — Progress Notes (Signed)
Virtual Visit via Video Note Pt contacted via doxy, however pt unable to hear this provider 2/2 connection issues.  Pt called on the phone for remainder of visit.  Pt's phone cut off x 2, but pt called again to finish visit.  I connected with Kenn File on 06/30/19 at  4:00 PM EST by a video enabled telemedicine application 2/2 [id number] pandemic and verified that I am speaking with the correct person using two identifiers.  Location patient: home Location provider:work or home office Persons participating in the virtual visit: patient, provider  I discussed the limitations of evaluation and management by telemedicine and the availability of in person appointments. The patient expressed understanding and agreed to proceed.   HPI: Pt called her rheumatologist, but was advised to see her pcp. Pt did not take embrel shot on Saturday as she feels like she is fighting an infection.  Pt feels fatigued, "foggy headed", HA, ear pain/pressure, eye feel heavy, nasal congestion, cough, post nasal drainage, nausea, loose stools. Pt has been sick x a few wks. Denies rhinorrhea, sore throat, fever, vomiting, loss of taste or smell, no change in appetite.  Pt took dayquil, nasal rinse.    Pt's daughter's boyfriend tested positive for COVID.  Pt's daughter feels well but lives with the pt.   ROS: See pertinent positives and negatives per HPI.  Past Medical History:  Diagnosis Date  . ALLERGIC RHINITIS 10/08/2009  . Cancer ([place]) 1996   infiltrating ductal, lumpectomy with axillary node dissection.  ER posivtive     . COLONIC POLYPS 10/08/2009  . Edema 10/08/2009  . Graves disease   . HYPERTHYROIDISM 11/03/2009  . Microscopic hematuria 11/01/2009  . NEOPLASM, MALIGNANT, BREAST, HX OF 10/08/2009  . THYROID FUNCTION TEST, ABNORMAL 11/01/2009    Past Surgical History:  Procedure Laterality Date  . BREAST SURGERY     breast CA, 6 weeks radiation  . CHOLECYSTECTOMY    . labrum tear Right 2/12     Family History  Problem Relation Age of Onset  . Arthritis Mother 17       RA  . Alcohol abuse Father   . Hypertension Father   . Alcohol abuse Sister   . Alcohol abuse Brother      Current Outpatient Medications:  .  albuterol (PROVENTIL HFA;VENTOLIN HFA) 108 (90 Base) MCG/ACT inhaler, Inhale 1-2 puffs into the lungs every 6 (six) hours as needed for wheezing or shortness of breath., Disp: 1 Inhaler, Rfl: 0 .  amoxicillin-clavulanate (AUGMENTIN) 875-125 MG tablet, Take 1 tablet by mouth 2 (two) times daily., Disp: 20 tablet, Rfl: 0 .  cetirizine (ZYRTEC) 10 MG tablet, Take 1 tablet (10 mg total) by mouth daily. For at least 2 weeks, Disp: 30 tablet, Rfl: 1 .  cyclobenzaprine (FLEXERIL) 5 MG tablet, Take 1 tablet (5 mg total) by mouth 3 (three) times daily as needed for muscle spasms., Disp: 40 tablet, Rfl: 1 .  gabapentin (NEURONTIN) 100 MG capsule, TAKE 2 CAPSULES (200 MG TOTAL) BY MOUTH AT BEDTIME., Disp: 180 capsule, Rfl: 2 .  levothyroxine (SYNTHROID, LEVOTHROID) 137 MCG tablet, Take 137 mcg by mouth daily., Disp: , Rfl:  .  linaclotide (LINZESS) 290 MCG CAPS capsule, Take by mouth., Disp: , Rfl:  .  meloxicam (MOBIC) 15 MG tablet, TAKE 1 TABLET BY MOUTH EVERY DAY, Disp: 30 tablet, Rfl: 0 .  predniSONE (DELTASONE) 10 MG tablet, Taper as follows: 4-4-4-4-3-3-2-2-1-1, Disp: 28 tablet, Rfl: 0  EXAM:  VITALS per patient if applicable: RR  between 12-20 bpm  GENERAL: alert, oriented, appears well and in no acute distress  HEENT: atraumatic, conjunctiva clear, no obvious abnormalities on inspection of external nose and ears  NECK: normal movements of the head and neck  LUNGS: on inspection no signs of respiratory distress, breathing rate appears normal, no obvious gross SOB, gasping or wheezing  CV: no obvious cyanosis  MS: moves all visible extremities without noticeable abnormality  PSYCH/NEURO: pleasant and cooperative, no obvious depression or anxiety, speech and thought  processing grossly intact  ASSESSMENT AND PLAN:  Discussed the following assessment and plan:  Viral URI with cough  -Discussed supportive care -Advised to self quarantine -Given precautions - Plan: fluticasone (FLONASE) 50 MCG/ACT nasal spray  Exposure to COVID-19 virus -Discussed signs and symptoms of COVID-19 -Given information about area testing sites  Patient advised to follow-up as needed for continued or worsening symptoms given history of Enbrel use.   I discussed the assessment and treatment plan with the patient. The patient was provided an opportunity to ask questions and all were answered. The patient agreed with the plan and demonstrated an understanding of the instructions.   The patient was advised to call back or seek an in-person evaluation if the symptoms worsen or if the condition fails to improve as anticipated.  I provided 17 minutes of non-face-to-face time during this encounter.   Billie Ruddy, MD

## 2019-07-16 ENCOUNTER — Other Ambulatory Visit: Payer: Self-pay

## 2019-07-16 ENCOUNTER — Telehealth (INDEPENDENT_AMBULATORY_CARE_PROVIDER_SITE_OTHER): Payer: No Typology Code available for payment source | Admitting: Family Medicine

## 2019-07-16 ENCOUNTER — Encounter: Payer: Self-pay | Admitting: Family Medicine

## 2019-07-16 DIAGNOSIS — J019 Acute sinusitis, unspecified: Secondary | ICD-10-CM

## 2019-07-16 MED ORDER — AMOXICILLIN-POT CLAVULANATE 875-125 MG PO TABS: 1.0000 | ORAL_TABLET | Freq: Two times a day (BID) | ORAL | 0 refills | Status: DC

## 2019-07-16 NOTE — Progress Notes (Signed)
This visit type was conducted due to national recommendations for restrictions regarding the COVID-19 pandemic in an effort to limit this patient's exposure and mitigate transmission in our community.   Virtual Visit via Video Note  I connected with Kenn File on 07/16/19 at  4:15 PM EST by a video enabled telemedicine application and verified that I am speaking with the correct person using two identifiers.  Location patient: home Location provider:work or home office Persons participating in the virtual visit: patient, provider  I discussed the limitations of evaluation and management by telemedicine and the availability of in person appointments. The patient expressed understanding and agreed to proceed.   HPI:  Alicia Mcconnell has had some persistent sinus symptoms.  She had virtual visit on the seventh.  She had had possible exposure to her daughter's boyfriend who tested positive for Covid.  Sosha went and was tested around the seventh and her test came back negative.  She has some sinus congestion and thick postnasal drainage with thick green mucus.  No bloody drainage.  No headaches but she has significant facial fullness.  Persistent fatigue.  No dyspnea.  No diarrhea.  No loss of taste or smell.  Has been taking Mucinex 1200 mg daily which helps somewhat.  She is followed by rheumatology and takes Enbrel but has been off for the past 2 weeks.  She states she has been very isolated and is basically not had any high risk type exposures to anyone since issues above.  She denies any significant myalgias.  She has had sinus infections in the past and states these symptoms are very similar.   ROS: See pertinent positives and negatives per HPI.  Past Medical History:  Diagnosis Date  . ALLERGIC RHINITIS 10/08/2009  . Cancer ([place]) 1996   infiltrating ductal, lumpectomy with axillary node dissection.  ER posivtive     . COLONIC POLYPS 10/08/2009  . Edema 10/08/2009  . Graves disease   .  HYPERTHYROIDISM 11/03/2009  . Microscopic hematuria 11/01/2009  . NEOPLASM, MALIGNANT, BREAST, HX OF 10/08/2009  . THYROID FUNCTION TEST, ABNORMAL 11/01/2009    Past Surgical History:  Procedure Laterality Date  . BREAST SURGERY     breast CA, 6 weeks radiation  . CHOLECYSTECTOMY    . labrum tear Right 2/12    Family History  Problem Relation Age of Onset  . Arthritis Mother 77       RA  . Alcohol abuse Father   . Hypertension Father   . Alcohol abuse Sister   . Alcohol abuse Brother     SOCIAL HX: Ex-smoker   EXAM:  VITALS per patient if applicable:  GENERAL: alert, oriented, appears well and in no acute distress  HEENT: atraumatic, conjunttiva clear, no obvious abnormalities on inspection of external nose and ears  NECK: normal movements of the head and neck  LUNGS: on inspection no signs of respiratory distress, breathing rate appears normal, no obvious gross SOB, gasping or wheezing  CV: no obvious cyanosis  MS: moves all visible extremities without noticeable abnormality  PSYCH/NEURO: pleasant and cooperative, no obvious depression or anxiety, speech and thought processing grossly intact  ASSESSMENT AND PLAN:  Discussed the following assessment and plan:  Persistent sinusitis symptoms with change of symptoms as above with increasing thick green mucus for several weeks  -Given duration of symptoms start Augmentin 875 mg twice daily with food for 10 days -Continue Mucinex and good hydration -Follow-up promptly for any fever or for any persistent or worsening symptoms  I discussed the assessment and treatment plan with the patient. The patient was provided an opportunity to ask questions and all were answered. The patient agreed with the plan and demonstrated an understanding of the instructions.   The patient was advised to call back or seek an in-person evaluation if the symptoms worsen or if the condition fails to improve as anticipated.    Carolann Littler, MD

## 2019-07-27 ENCOUNTER — Other Ambulatory Visit: Payer: Self-pay | Admitting: Family Medicine

## 2019-07-27 DIAGNOSIS — J069 Acute upper respiratory infection, unspecified: Secondary | ICD-10-CM

## 2019-07-28 NOTE — Telephone Encounter (Signed)
Okay to refill with 3 additional refills

## 2019-07-28 NOTE — Telephone Encounter (Signed)
Last Rx given by Dr Volanda Napoleon

## 2019-07-28 NOTE — Telephone Encounter (Signed)
Rx done. 

## 2019-07-28 NOTE — Telephone Encounter (Signed)
Dr. Burchette pt  

## 2019-08-19 ENCOUNTER — Ambulatory Visit
Admission: RE | Admit: 2019-08-19 | Discharge: 2019-08-19 | Disposition: A | Discharge status: Home / Self Care | Payer: No Typology Code available for payment source | Source: Ambulatory Visit | Attending: Sports Medicine | Admitting: Sports Medicine

## 2019-08-19 ENCOUNTER — Ambulatory Visit: Payer: No Typology Code available for payment source | Admitting: Sports Medicine

## 2019-08-19 ENCOUNTER — Other Ambulatory Visit: Payer: Self-pay

## 2019-08-19 VITALS — BP 104/70 | Ht 67.0 in | Wt 180.0 lb

## 2019-08-19 DIAGNOSIS — M25512 Pain in left shoulder: Secondary | ICD-10-CM

## 2019-08-20 ENCOUNTER — Encounter: Payer: Self-pay | Admitting: Sports Medicine

## 2019-08-20 NOTE — Progress Notes (Signed)
Subjective:    [name], female    DOB: 1961/07/20, 59 y.o.   MRN: [id number]  HPI chief complaint: Left shoulder pain  Very pleasant and active right-hand-dominant 59 year old female comes in today complaining of several months of left shoulder pain.  Patient has a surgical history significant for left shoulder arthroscopy for distal clavicle excision, subacromial decompression, coracoid decompression, and debridement of the labrum performed in 2015 by Dr. Ivin Booty.  She states that her shoulder never quite felt 100% after surgery but it was tolerable up until a little less than a year ago.  She has been working with a Physiological scientist and has found increasing shoulder pain with certain activities.  She states that she gets pain with overhead activity as well as with any sort of position of external rotation and abduction.  Her pain is localized primarily along the lateral shoulder but she does get some posterior shoulder pain as well.  Pain is worse with certain exercises such as dead lifts and any sort of overhead activity.  She does endorse some mild instability as well.  Medical history significant for psoriatic arthritis for which she is usually on Enbrel but is currently off of that medicine.  Her rheumatologist did put her on prednisone a couple of weeks ago which helped with other body parts but not her shoulder.  She does endorse intermittent numbness and tingling down the left arm but it is not associated with her left shoulder pain.  No recent trauma.  Past medical history reviewed, medications reviewed Surgical history reviewed.  Significant for the aforementioned left shoulder arthroscopy.  She also has a history of a remote right shoulder labral repair and has done very well postoperatively with this.    Review of Systems    As above Objective:   Physical Exam   Well-developed, well-nourished.  No acute distress.  Awake alert and oriented x3.  Vital signs  reviewed  Left shoulder: Patient demonstrates full range of motion in all planes.  She does have a positive painful arc with abduction.  She also has pain at the extreme of overhead forward flexion.  Pain with internal rotation as well.  No pain with external rotation.  There is no obvious atrophy.  Mildly positive empty can, mildly positive Hawkins.  No tenderness over the acromioclavicular joint nor over the bicipital groove.  Rotator cuff strength is 5/5 bilaterally but does cause pain with resisted supraspinatus.  Negative O'Brien's.  Neurovascular intact distally.  X-rays of her left shoulder including AP and axillary views shows evidence of her previous distal clavicle excision but a well-preserved joint space.  Nothing acute.       Assessment & Plan:   Chronic left shoulder pain likely secondary to rotator cuff tendinopathy Status post left shoulder arthroscopy for DCE, bursectomy, and decompression in 2015  Patient will return to the office later this week for a complete ultrasound of the left shoulder.  We will delineate treatment based on those findings.  I suspect that she is getting symptoms from rotator cuff tendinopathy and possibly impingement although this is less likely given her previous surgery.  She has already started doing some limited rotator cuff strengthening and is doing scapular stabilization exercises.  I encouraged her to continue with those.  She will need to avoid any exercise in the gym that causes pain and she will need to avoid any overhead exercise from now on.  We will delineate further treatment based on her ultrasound  findings later this week.

## 2019-08-22 ENCOUNTER — Ambulatory Visit (INDEPENDENT_AMBULATORY_CARE_PROVIDER_SITE_OTHER): Payer: No Typology Code available for payment source | Admitting: Sports Medicine

## 2019-08-22 ENCOUNTER — Other Ambulatory Visit: Payer: Self-pay

## 2019-08-22 ENCOUNTER — Ambulatory Visit: Payer: Self-pay

## 2019-08-22 VITALS — BP 110/72 | Ht 67.0 in | Wt 180.0 lb

## 2019-08-22 DIAGNOSIS — M25512 Pain in left shoulder: Secondary | ICD-10-CM

## 2019-08-22 MED ORDER — NITROGLYCERIN 0.2 MG/HR TD PT24: MEDICATED_PATCH | TRANSDERMAL | 1 refills | Status: DC

## 2019-08-22 NOTE — Patient Instructions (Signed)

## 2019-08-22 NOTE — Addendum Note (Signed)
Addended by: Lilia Argue R on: 08/22/2019 01:38 PM   Modules accepted: Level of Service

## 2019-08-22 NOTE — Progress Notes (Addendum)
  Alicia Mcconnell - 59 y.o. female MRN [id number]  Date of birth: 1960/11/17  Patient presents today for complete ultrasound evaluation of the left shoulder.  Please see the office note from January 26 for details regarding history and physical exam findings.  ULTRASOUND: Shoulder, left  Diagnostic complete ultrasound imaging obtained of patient's left shoulder.  - No obvious evidence of bony deformity or osteophyte development appreciated.  - Long head of the biceps tendon: No evidence of tendon thickening, calcification, subluxation, or tearing in short or long axis views. Small amount of edema with bullseye sign - Subscapularis tendon: complete visualization across the width of the insertion point yielded noevidence of tendon thickening, calcification, or tears in the long axis view. Swelling noted. - Supraspinatus tendon: complete visualization across the width of the insertion point yielded intrasubstance partial tears in the long axis view. No evidence of bursal inflammation appreciated.  - Infraspinatus and teres minor tendons: visualization across the width of the insertion points yielded evidence of tendinopathy in the long axis view.  [facility] Joint: post-surgical changes noted from distal clavicle excision. No effusion present.   IMPRESSION: findings consistent with supraspinatus partial tear with infraspinatus and teres minor tendinopathy    ASSESSMENT & PLAN:   Patient's ultrasound findings are most consistent with rotator cuff tendinopathy and partial tearing.  I recommended a trial of topical nitroglycerin patches and formal physical therapy.  Patient will contact me with her physical therapist of choice.  She will follow-up with me in the office in 6 weeks for reevaluation.  I do not anticipate the need for repeat scan unless her symptoms worsen.

## 2019-10-07 ENCOUNTER — Ambulatory Visit: Payer: No Typology Code available for payment source | Admitting: Sports Medicine

## 2019-10-24 ENCOUNTER — Other Ambulatory Visit: Payer: Self-pay | Admitting: Family Medicine

## 2019-10-24 DIAGNOSIS — J069 Acute upper respiratory infection, unspecified: Secondary | ICD-10-CM

## 2020-01-30 ENCOUNTER — Ambulatory Visit: Payer: No Typology Code available for payment source | Admitting: Family Medicine

## 2020-02-05 ENCOUNTER — Ambulatory Visit (INDEPENDENT_AMBULATORY_CARE_PROVIDER_SITE_OTHER): Payer: No Typology Code available for payment source | Admitting: Sports Medicine

## 2020-02-05 ENCOUNTER — Other Ambulatory Visit: Payer: Self-pay

## 2020-02-05 VITALS — BP 112/72 | Ht 66.5 in | Wt 180.0 lb

## 2020-02-05 DIAGNOSIS — M25512 Pain in left shoulder: Secondary | ICD-10-CM | POA: Diagnosis not present

## 2020-02-05 DIAGNOSIS — M79671 Pain in right foot: Secondary | ICD-10-CM

## 2020-02-05 NOTE — Progress Notes (Signed)
   Subjective:    [name], female    DOB: May 25, 1961, 59 y.o.   MRN: [id number]  HPI chief complaint: Right lower leg pain and left shoulder pain  Patient comes in today with a couple of different complaints.  Main complaint is right lower leg pain that she localizes to the medial calcaneus.  She describes it as a burning type pain with associated numbness that is present only with certain activities such as bending at the waist with her knees bent.  Symptoms have been present now for several months without any known trauma.  She also gets associated intermittent calf cramping.  She received a deep tissue massage to the right calf a couple of days ago which was tremendously helpful.  She denies any pain more proximal in the knee or lower leg.  No low back pain.  No weakness.  She is also complaining of persistent left shoulder pain.  Ultrasound done in January of this year showed findings consistent with probable partial rotator cuff tearing.  She tried topical nitroglycerin and home exercises as well as formal physical therapy but despite that her pain persist.  It is most noticeable when putting the arm in an abducted and externally rotated position.  Also painful with repetitive motion such as pulling weeds in her garden.  No recent trauma.    Review of Systems As above    Objective:   Physical Exam  Well-developed, well-nourished.  No acute distress.  Awake alert and oriented x3.  Vital signs reviewed  Left shoulder: Full active and passive range of motion.  Positive painful arc.  No tenderness over the acromioclavicular joint.  Positive empty can, positive Hawkins.  Rotator cuff strength is 5/5 but reproducible pain with resisted supraspinatus and infraspinatus.  Neurovascular intact distally.  Right ankle: Full range of motion.  No effusion.  No soft tissue swelling.  No tenderness to palpation at the calcaneal origin of the plantar fascia.  Skin is  intact.  Neurological exam: Negative straight leg raise bilaterally.  Strength is 5/5 in both lower extremities.  Reflexes are trace but equal at the patellar and Achilles tendons bilaterally.  No atrophy.  Good pulses.      Assessment & Plan:   Chronic left shoulder pain likely secondary to partial rotator cuff tear Status post remote left shoulder DCE Neuropathic right ankle pain  MRI of the left shoulder specifically to rule out rotator cuff pathology that may benefit from surgical debridement.  Phone follow-up with those findings when available.  For her neuropathic right ankle pain, her location of pain is suspicious of calcaneal nerve irritation likely more proximal in the calf.  Her symptoms are reproducible when contracting her soleus so I think that there may be some nerve entrapment at that level.  I recommended formal physical therapy for calf stretching and massage.  I have also asked her to avoid those specific exercises in the gym that reproduce her symptoms as neuropathic pain can take a long time to resolve.  If her symptoms persist then I would consider merits of an EMG/nerve conduction study.  Although lumbar radiculopathy is unlikely, it is definitely in the differential.

## 2020-03-06 ENCOUNTER — Ambulatory Visit
Admission: RE | Admit: 2020-03-06 | Discharge: 2020-03-06 | Disposition: A | Discharge status: Home / Self Care | Payer: No Typology Code available for payment source | Source: Ambulatory Visit | Attending: Sports Medicine | Admitting: Sports Medicine

## 2020-03-06 DIAGNOSIS — M25512 Pain in left shoulder: Secondary | ICD-10-CM

## 2020-03-11 ENCOUNTER — Telehealth: Payer: Self-pay | Admitting: Sports Medicine

## 2020-03-11 NOTE — Telephone Encounter (Signed)
  I spoke with the patient on the phone today after reviewing MRI findings of her left shoulder.  She has only some mild fraying of the articular surface of the supraspinatus tendon.  She also has some partial-thickness cartilage loss of the glenohumeral joint.  Her main complaint continues to be limited range of motion as well as pain in certain planes.  I recommended that she return to the office so that I can repeat her physical exam and clinically correlate with what I see on MRI.  I reassured her that I see no definitive operative pathology and I recommended that we continue with conservative treatment for now given the fact that she has already had surgery previously on the same shoulder.

## 2020-05-06 ENCOUNTER — Ambulatory Visit: Payer: No Typology Code available for payment source | Admitting: Sports Medicine

## 2020-05-29 ENCOUNTER — Encounter: Payer: Self-pay | Admitting: Family Medicine

## 2020-06-04 ENCOUNTER — Encounter: Payer: Self-pay | Admitting: Family Medicine

## 2020-06-08 ENCOUNTER — Ambulatory Visit: Payer: No Typology Code available for payment source | Admitting: Sports Medicine

## 2020-06-15 ENCOUNTER — Encounter: Payer: Self-pay | Admitting: Sports Medicine

## 2020-06-15 ENCOUNTER — Other Ambulatory Visit: Payer: Self-pay

## 2020-06-15 ENCOUNTER — Ambulatory Visit: Payer: No Typology Code available for payment source | Admitting: Sports Medicine

## 2020-06-15 VITALS — BP 122/84 | Ht 67.0 in | Wt 180.0 lb

## 2020-06-15 DIAGNOSIS — M25512 Pain in left shoulder: Secondary | ICD-10-CM

## 2020-06-15 NOTE — Progress Notes (Signed)
   PCP: Eulas Post, MD  Subjective:   HPI: Patient is a 59 y.o. female with history of left shoulder arthroscopy for DCE, bursectomy and decompression 2015, along with right shoulder labral repair in 2012 who is here for reevaluation of left shoulder pain.  She has been followed here since 07/2019, at that time she was diagnosed with left supraspinatus partial tear.  Thus far, she has been managed conservatively with initial rest, physical therapy, nitroglycerin patches, oral anti-inflammatories.  She reports that since then, she has continued to have significant left shoulder pain and now reports that the pain is mostly in the posterior aspect of her shoulder.    At her last visit, an MRI of the left shoulder was obtained which showed mild supraspinatus tendinosis, partial tearing on the articular surface, and partial-thickness cartilage loss of the glenohumeral joint.  Otherwise unremarkable.  Today, patient states that the pain is severe and is inhibiting her daily life.  She especially has pain when she is trying to push up off the ground to stand, or when she is trying to open jars or medicine bottles.  The pain is now more in her posterior shoulder, which feels different to her from earlier this year.  She has not had any new falls or injuries that she is aware of.  No numbness or tingling, no obvious weakness in her shoulder.  She is not having any pain with overhead movements.   Review of Systems:  Per HPI.   [place], medications and smoking status reviewed.      Objective:  Physical Exam:  No flowsheet data found.   Gen: awake, alert, NAD, comfortable in exam room Pulm: breathing unlabored  Left Shoulder: -Inspection: no obvious deformity, atrophy, or asymmetry. No bruising. No swelling -Palpation: no TTP over [facility] joint or bicipital groove.  She does have some tenderness over the posterior aspect of the shoulder. -ROM: Full ROM in abduction, flexion, internal/external  rotation both passively and actively .  Some pain with internal rotation, otherwise nonpainful. NV intact distally Normal scapular function observed. Special Tests:  - Impingement:  Mildly positive Hawkins, Neg neers, Neg empty can sign. - Supraspinatus: Negative empty can.  5/5 strength with resisted flexion at 20 degrees - Infraspinatus/Teres Minor: 5/5 strength with ER without pain - Subscapularis: 5/5 strength with IR - Biceps tendon: Neg Speeds - Labrum: Negative Obriens, good stability - No painful arc and no drop arm sign    Assessment & Plan:  1.  Left shoulder pain  Differential includes rotator cuff tendinopathy versus glenohumeral arthritis versus posterior labral tear.  Patient does have some feeling of instability and posterior pain which raises a question of labral tear.  MRI does not show this, however it is possible this was missed.  Given that she has failed conservative therapy which we have now tried for about 10 months, we will plan to refer to orthopedics for consideration of surgical evaluation.  Dagoberto Ligas, MD [facility] Fellow 06/15/2020 9:30 AM   Patient seen and evaluated with the sports medicine fellow.  I agree with the above plan of care.  This is a chronic problem for this patient and I think she should have a surgical consultation.  Therefore, we will refer her to [name] to discuss this further.  She will follow up with me as needed.

## 2020-06-15 NOTE — Patient Instructions (Signed)
Thank you for coming in to see [place] today! Please see below to review our plan for today's visit:   Since your pain has not improved with conservative measures, we will plan to refer you to [name] for surgical consultation.  Please continue to avoid aggravating activities and you may continue as needed anti-inflammatories like meloxicam for pain in the meantime.   Please call the clinic at [phone] if your symptoms worsen or you have any concerns. It was our pleasure to serve you.       Dr. Dagoberto Ligas Dr[facility]

## 2020-06-22 ENCOUNTER — Other Ambulatory Visit: Payer: Self-pay

## 2020-06-22 ENCOUNTER — Encounter: Payer: Self-pay | Admitting: Family Medicine

## 2020-06-23 DIAGNOSIS — C50919 Malignant neoplasm of unspecified site of unspecified female breast: Secondary | ICD-10-CM

## 2020-06-23 HISTORY — DX: Malignant neoplasm of unspecified site of unspecified female breast: C50.919

## 2020-06-24 ENCOUNTER — Encounter: Payer: Self-pay | Admitting: Family Medicine

## 2020-06-24 ENCOUNTER — Ambulatory Visit: Payer: No Typology Code available for payment source | Admitting: Orthopaedic Surgery

## 2020-06-24 ENCOUNTER — Other Ambulatory Visit: Payer: Self-pay

## 2020-07-01 ENCOUNTER — Telehealth: Payer: Self-pay | Admitting: Genetic Counselor

## 2020-07-01 NOTE — Telephone Encounter (Signed)
Received an urgent genetic counseling referral from Dr. Brantley Stage at [facility] for breast cancer. Alicia Mcconnell has been cld and scheduled to see Santiago Glad on 12/14 at 10am. Pt aware to arrive 15 minutes early.

## 2020-07-05 ENCOUNTER — Inpatient Hospital Stay: Payer: No Typology Code available for payment source

## 2020-07-05 ENCOUNTER — Inpatient Hospital Stay: Payer: No Typology Code available for payment source | Attending: Genetic Counselor | Admitting: Genetic Counselor

## 2020-07-05 ENCOUNTER — Encounter: Payer: Self-pay | Admitting: Genetic Counselor

## 2020-07-05 ENCOUNTER — Other Ambulatory Visit: Payer: Self-pay

## 2020-07-05 DIAGNOSIS — Z853 Personal history of malignant neoplasm of breast: Secondary | ICD-10-CM

## 2020-07-05 DIAGNOSIS — Z803 Family history of malignant neoplasm of breast: Secondary | ICD-10-CM | POA: Diagnosis not present

## 2020-07-05 DIAGNOSIS — Z8042 Family history of malignant neoplasm of prostate: Secondary | ICD-10-CM

## 2020-07-05 DIAGNOSIS — D0511 Intraductal carcinoma in situ of right breast: Secondary | ICD-10-CM | POA: Insufficient documentation

## 2020-07-05 NOTE — Progress Notes (Signed)
REFERRING PROVIDER: Eulas Post, MD [place],   [id number]  PRIMARY PROVIDER:  Eulas Post, MD  PRIMARY REASON FOR VISIT:  1. NEOPLASM, MALIGNANT, BREAST, HX OF   2. Family history of breast cancer   3. Family history of prostate cancer   4. Ductal carcinoma in situ (DCIS) of right breast      HISTORY OF PRESENT ILLNESS:   [name], a 59 y.o. female, was seen for a [place] cancer genetics consultation at the request of Dr. Elease Hashimoto due to a personal and family history of breast cancer and family history of prostate cancer.  Alicia Mcconnell presents to clinic today to discuss the possibility of a hereditary predisposition to cancer, genetic testing, and to further clarify her future cancer risks, as well as potential cancer risks for family members.   In 1996, at the age of 10, Alicia Mcconnell was diagnosed with invasive ductal carcinoma of the right breast. The treatment plan lumpectomy and radiation.  In 2021, at the age of 8, Alicia Mcconnell was diagnosed with DCIS of the right breast.  The treatment plan includes possible mastectomy with reconstruction.  She has never had genetic testing.     CANCER HISTORY:  Oncology History   No history exists.     RISK FACTORS:  Menarche was at age 62.  First live birth at age 47.  OCP use for approximately 3-4 years.  Ovaries intact: yes.  Hysterectomy: no.  Menopausal status: postmenopausal.  HRT use: 0 years. Colonoscopy: yes; polyps. Mammogram within the last year: yes. Number of breast biopsies: 3. Up to date with pelvic exams: yes. Any excessive radiation exposure in the past: no  Past Medical History:  Diagnosis Date  . ALLERGIC RHINITIS 10/08/2009  . Cancer ([address]) 1996   infiltrating ductal, lumpectomy with axillary node dissection.  ER posivtive     . COLONIC POLYPS 10/08/2009  . Edema 10/08/2009  . Family history of breast cancer   . Family history of prostate cancer   . Graves  disease   . HYPERTHYROIDISM 11/03/2009  . Microscopic hematuria 11/01/2009  . NEOPLASM, MALIGNANT, BREAST, HX OF 10/08/2009  . THYROID FUNCTION TEST, ABNORMAL 11/01/2009    Past Surgical History:  Procedure Laterality Date  . BREAST SURGERY     breast CA, 6 weeks radiation  . CHOLECYSTECTOMY    . labrum tear Right 2/12    Social History   Socioeconomic History  . Marital status: Divorced    Spouse name: Not on file  . Number of children: Not on file  . Years of education: Not on file  . Highest education level: Not on file  Occupational History  . Not on file  Tobacco Use  . Smoking status: Current Every Day Smoker    Packs/day: 0.50    Years: 25.00    Pack years: 12.50    Types: Cigarettes    Last attempt to quit: 09/13/2010    Years since quitting: 9.8  . Smokeless tobacco: Never Used  Substance and Sexual Activity  . Alcohol use: Yes    Alcohol/week: 0.0 standard drinks    Comment: rare  . Drug use: No  . Sexual activity: Not on file  Other Topics Concern  . Not on file  Social History Narrative  . Not on file   Social Determinants of Health   Financial Resource Strain: Not on file  Food Insecurity: Not on file  Transportation Needs: Not on file  Physical Activity:  Not on file  Stress: Not on file  Social Connections: Not on file     FAMILY HISTORY:  We obtained a detailed, 4-generation family history.  Significant diagnoses are listed below: Family History  Problem Relation Age of Onset  . Arthritis Mother 90       RA  . Alcohol abuse Father   . Hypertension Father   . COPD Father        d. 57  . Alcohol abuse Sister   . Alcohol abuse Brother   . Breast cancer Paternal Aunt        x2 dx  . Heart attack Maternal Grandfather   . Prostate cancer Paternal Grandfather        dx 53s  . Healthy Daughter      The patient has one daughter who is cancer free.  She has a brother and a sister who are cancer free.  Both parents are deceased.  The  patient's father died of COPD at age 10.  He has two sisters and a brother.  One sister had breast cancer twice and currently has lung cancer.  The paternal grandparents are deceased, the grandfather had prostate cancer.  The patient's mother died from complications of rheumatoid arthritis.  She had six sisters and a brother who are cancer free. The maternal grandparents are deceased from non cancer related issues.  Ms. Adorno is unaware of previous family history of genetic testing for hereditary cancer risks. Patient's maternal ancestors are of [place] descent, and paternal ancestors are of [place] descent. There is no reported Ashkenazi Jewish ancestry. There is no known consanguinity.  GENETIC COUNSELING ASSESSMENT: Alicia Mcconnell is a 59 y.o. female with a personal and family history of cancer which is somewhat suggestive of a hereditary cancer syndrome and predisposition to cancer given her young age of onset and multiple diagnoses, as well as the combination of cancer in the family. We, therefore, discussed and recommended the following at today's visit.   DISCUSSION: We discussed that 5 - 10% of breast cancer is hereditary, with most cases associated with BRCA mutations.  There are other genes that can be associated with hereditary breast cancer syndromes.  These include ATM, CHEK2 and PALB2. Based on her young age of onset of breast cancer and two diagnoses of breast cancer, she is at a higher risk for a hereditary cancer syndrome.  We discussed that testing is beneficial for several reasons including knowing how to follow individuals after completing their treatment, identifying whether potential treatment options such as PARP inhibitors would be beneficial, and understand if other family members could be at risk for cancer and allow them to undergo genetic testing.   We reviewed the characteristics, features and inheritance patterns of hereditary cancer syndromes. We also discussed  genetic testing, including the appropriate family members to test, the process of testing, insurance coverage and turn-around-time for results. We discussed the implications of a negative, positive, carrier and/or variant of uncertain significant result. We recommended Ms. Hogen pursue genetic testing for the CancerNext-Expanded+RNA gene panel. The CancerNext-Expanded gene panel offered by [facility] and includes sequencing and rearrangement analysis for the following 77 genes: AIP, ALK, APC*, ATM*, AXIN2, BAP1, BARD1, BLM, BMPR1A, BRCA1*, BRCA2*, BRIP1*, CDC73, CDH1*, CDK4, CDKN1B, CDKN2A, CHEK2*, CTNNA1, DICER1, FANCC, FH, FLCN, GALNT12, KIF1B, LZTR1, MAX, MEN1, MET, MLH1*, MSH2*, MSH3, MSH6*, MUTYH*, NBN, NF1*, NF2, NTHL1, PALB2*, PHOX2B, PMS2*, POT1, PRKAR1A, PTCH1, PTEN*, RAD51C*, RAD51D*, RB1, RECQL, RET, SDHA, SDHAF2, SDHB, SDHC, SDHD, SMAD4, SMARCA4,  SMARCB1, SMARCE1, STK11, SUFU, TMEM127, TP53*, TSC1, TSC2, VHL and XRCC2 (sequencing and deletion/duplication); EGFR, EGLN1, HOXB13, KIT, MITF, PDGFRA, POLD1, and POLE (sequencing only); EPCAM and GREM1 (deletion/duplication only). DNA and RNA analyses performed for * genes.   Based on Ms. Pleitez's personal and family history of cancer, she meets medical criteria for genetic testing. Despite that she meets criteria, she may still have an out of pocket cost. We discussed that if her out of pocket cost for testing is over $100, the laboratory will call and confirm whether she wants to proceed with testing.  If the out of pocket cost of testing is less than $100 she will be billed by the genetic testing laboratory.   PLAN: After considering the risks, benefits, and limitations, Ms. Desha provided informed consent to pursue genetic testing and the blood sample was sent to [facility] for analysis of the CancerNext-Expanded+RNAinsight. Results should be available within approximately 2-3 weeks' time, at which point they will be  disclosed by telephone to Ms. Maraj, as will any additional recommendations warranted by these results. Ms. Pingley will receive a summary of her genetic counseling visit and a copy of her results once available. This information will also be available in Epic.   Lastly, we encouraged Ms. Magno to remain in contact with cancer genetics annually so that we can continuously update the family history and inform her of any changes in cancer genetics and testing that may be of benefit for this family.   Ms. Archbold questions were answered to her satisfaction today. Our contact information was provided should additional questions or concerns arise. Thank you for the referral and allowing [place] to share in the care of your patient.   Calen Geister P. Florene Glen, [place], [facility] Licensed, Insurance risk surveyor [email or web address] phone: [phone]  The patient was seen for a total of 40 minutes in face-to-face genetic counseling.  This patient was discussed with Drs. Magrinat, Lindi Adie and/or Burr Medico who agrees with the above.    _______________________________________________________________________ For Office Staff:  Number of people involved in session: 2 Was an Intern/ student involved with case: no

## 2020-07-13 ENCOUNTER — Telehealth: Payer: Self-pay | Admitting: Genetic Counselor

## 2020-07-13 NOTE — Telephone Encounter (Signed)
LM on VM that results were back and to please call. ?

## 2020-07-13 NOTE — Telephone Encounter (Signed)
The STAT BRCAPlus result was revealed as negative.  We are still waiting on the remainder of her testing.  We will call when the remainder of results are back.

## 2020-07-19 ENCOUNTER — Ambulatory Visit: Payer: Self-pay | Admitting: Surgery

## 2020-07-19 NOTE — H&P (Signed)
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 History of right breast cancer in 1996 treated with breast conserving surgery, axillary lymph node dissection, radiation therapy and chemotherapy. She has never had genetic testing. She underwent recent mammography which showed a cluster of right breast microcalcifications core biopsy-proven to be DCIS measuring about 2 cm. She is a cigarette smoker but plans on quitting. She has a sore right breast and stiff right breast and previous treatment but otherwise no complaint.  The patient is a 59 year old female.   Past Surgical History Darden Palmer, [place]; 06/29/2020 2:33 PM) Breast Biopsy  Bilateral. multiple Breast Mass; Local Excision  Right. Colon Polyp Removal - Colonoscopy  Gallbladder Surgery - Laparoscopic  Shoulder Surgery  Bilateral.  Diagnostic Studies History Darden Palmer, [place]; 06/29/2020 2:33 PM) Colonoscopy  within last year Mammogram  within last year Pap Smear  >5 years ago  Allergies Darden Palmer, RMA; 06/29/2020 2:35 PM) Sulfacetamide *CHEMICALS*  metroNIDAZOLE *CHEMICALS*  Allergies Reconciled   Medication History Darden Palmer, RMA; 06/29/2020 2:36 PM) Enbrel SureClick (50MG /ML Soln Auto-inj, Subcutaneous) Active. Folic Acid (1MG  Tablet, Oral) Active. Levothyroxine Sodium (125MCG Tablet, Oral) Active. Linzess (290MCG Capsule, Oral) Active. Methotrexate (2.5MG  Tablet, Oral) Active. Omeprazole (40MG  Capsule DR, Oral) Active. Medications Reconciled  Social History Darden Palmer, [place]; 06/29/2020 2:33 PM) Alcohol use  Occasional alcohol use. Caffeine use  Carbonated beverages, Coffee, Tea. No drug use  Tobacco use  Current some day smoker.  Family History Darden Palmer, [place]; 06/29/2020 2:33 PM) Alcohol Abuse  Father, Sister. Arthritis   Mother. Depression  Sister. Heart Disease  Father. Migraine Headache  Daughter. Prostate Cancer  Family Members In General. Respiratory Condition  Father.  Pregnancy / Birth History Darden Palmer, [place]; 06/29/2020 2:33 PM) Age of menopause  51-55 Contraceptive History  Intrauterine device, Oral contraceptives. Gravida  2 Length (months) of breastfeeding  >24 Maternal age  21-25 Para  43  Other Problems Darden Palmer, [place]; 06/29/2020 2:33 PM) Anxiety Disorder  Arthritis  Breast Cancer  Cholelithiasis  Lump In Breast  Thyroid Disease     Review of Systems Darden Palmer RMA; 06/29/2020 2:33 PM) General Not Present- Appetite Loss, Chills, Fatigue, Fever, Night Sweats, Weight Gain and Weight Loss. Skin Present- Dryness. Not Present- Change in Wart/Mole, Hives, Jaundice, New Lesions, Non-Healing Wounds, Rash and Ulcer. HEENT Present- Seasonal Allergies and Wears glasses/contact lenses. Not Present- Earache, Hearing Loss, Hoarseness, Nose Bleed, Oral Ulcers, Ringing in the Ears, Sinus Pain, Sore Throat, Visual Disturbances and Yellow Eyes. Breast Not Present- Breast Mass, Breast Pain, Nipple Discharge and Skin Changes. Gastrointestinal Present- Constipation. Not Present- Abdominal Pain, Bloating, Bloody Stool, Change in Bowel Habits, Chronic diarrhea, Difficulty Swallowing, Excessive gas, Gets full quickly at meals, Hemorrhoids, Indigestion, Nausea, Rectal Pain and Vomiting. Female Genitourinary Not Present- Frequency, Nocturia, Painful Urination, Pelvic Pain and Urgency. Musculoskeletal Present- Joint Stiffness. Not Present- Back Pain, Joint Pain, Muscle Pain, Muscle Weakness and Swelling of Extremities. Neurological Not Present- Decreased Memory, Fainting, Headaches, Numbness, Seizures, Tingling, Tremor, Trouble walking and Weakness. Psychiatric Present- Anxiety. Not Present- Bipolar, Change in Sleep Pattern, Depression, Fearful and Frequent crying. Endocrine Present- Hot  flashes. Not Present- Cold Intolerance, Excessive Hunger, Hair Changes, Heat Intolerance and New Diabetes. Hematology Not Present- Blood Thinners, Easy Bruising, Excessive bleeding, Gland problems, HIV and Persistent Infections.  Vitals Lattie Haw [place] RMA; 06/29/2020 2:37 PM) 06/29/2020 2:36 PM Weight: 188.13 lb Height: 67in  Body Surface Area: 1.97 m Body Mass Index: 29.46 kg/m  Temp.: 74F  Pulse: 90 (Regular)  P.OX: 97% (Room air) BP: 128/82(Sitting, Left Arm, Standard)       Physical Exam (Johnryan Sao A. Hermela Hardt MD; 06/29/2020 3:27 PM) General Mental Status-Alert. General Appearance-Consistent with stated age. Hydration-Well hydrated. Voice-Normal.  Chest and Lung Exam Chest and lung exam reveals -quiet, even and easy respiratory effort with no use of accessory muscles and on auscultation, normal breath sounds, no adventitious sounds and normal vocal resonance. Inspection Chest Wall - Normal. Back - normal.  Breast Note: Postradiation changes noted right breast. Right breast is slightly smaller than left. No discrete masses. Scar from previous surgery. Scar noted in right axilla. No evidence of right arm lymphedema. Left breast is normal.   Cardiovascular Cardiovascular examination reveals -normal heart sounds, regular rate and rhythm with no murmurs and normal pedal pulses bilaterally.  Neurologic Neurologic evaluation reveals -alert and oriented x 3 with no impairment of recent or remote memory. Mental Status-Normal.  Lymphatic Head & Neck  General Head & Neck Lymphatics: Bilateral - Description - Normal. Axillary  General Axillary Region: Bilateral - Description - Normal. Tenderness - Non Tender.    Assessment & Plan (Derotha Fishbaugh A. Glorious Flicker MD; 06/29/2020 3:28 PM) BREAST NEOPLASM, TIS (DCIS), RIGHT (D05.11) Impression:  Patient seen by plastic surgery given previous tobacco use history and radiation have recommended delayed reconstruction.   She does not wish to go to [facility] at this point time and wishes to proceed with bilateral simple mastectomy. Patient had previous right axillary lymph node dissection therefore we'll proceed with right simple mastectomy and prophylactic/risk reducing left simple mastectomy.The surgical and non surgical options have been discussed with the patient.  Risks of surgery include bleeding,  Infection,  Flap necrosis,  Tissue loss,  Chronic pain, death, Numbness,  And the need for additional procedures.  Reconstruction options also have been discussed with the patient as well.  The patient agrees to proceed.   HISTORY OF RIGHT BREAST CANCER (Z85.3)

## 2020-07-19 NOTE — H&P (View-Only) (Signed)
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 History of right breast cancer in 1996 treated with breast conserving surgery, axillary lymph node dissection, radiation therapy and chemotherapy. She has never had genetic testing. She underwent recent mammography which showed a cluster of right breast microcalcifications core biopsy-proven to be DCIS measuring about 2 cm. She is a cigarette smoker but plans on quitting. She has a sore right breast and stiff right breast and previous treatment but otherwise no complaint.  The patient is a 59 year old female.   Past Surgical History Darden Palmer, [place]; 06/29/2020 2:33 PM) Breast Biopsy  Bilateral. multiple Breast Mass; Local Excision  Right. Colon Polyp Removal - Colonoscopy  Gallbladder Surgery - Laparoscopic  Shoulder Surgery  Bilateral.  Diagnostic Studies History Darden Palmer, [place]; 06/29/2020 2:33 PM) Colonoscopy  within last year Mammogram  within last year Pap Smear  >5 years ago  Allergies Darden Palmer, RMA; 06/29/2020 2:35 PM) Sulfacetamide *CHEMICALS*  metroNIDAZOLE *CHEMICALS*  Allergies Reconciled   Medication History Darden Palmer, RMA; 06/29/2020 2:36 PM) Enbrel SureClick (50MG /ML Soln Auto-inj, Subcutaneous) Active. Folic Acid (1MG  Tablet, Oral) Active. Levothyroxine Sodium (125MCG Tablet, Oral) Active. Linzess (290MCG Capsule, Oral) Active. Methotrexate (2.5MG  Tablet, Oral) Active. Omeprazole (40MG  Capsule DR, Oral) Active. Medications Reconciled  Social History Darden Palmer, [place]; 06/29/2020 2:33 PM) Alcohol use  Occasional alcohol use. Caffeine use  Carbonated beverages, Coffee, Tea. No drug use  Tobacco use  Current some day smoker.  Family History Darden Palmer, [place]; 06/29/2020 2:33 PM) Alcohol Abuse  Father, Sister. Arthritis   Mother. Depression  Sister. Heart Disease  Father. Migraine Headache  Daughter. Prostate Cancer  Family Members In General. Respiratory Condition  Father.  Pregnancy / Birth History Darden Palmer, [place]; 06/29/2020 2:33 PM) Age of menopause  51-55 Contraceptive History  Intrauterine device, Oral contraceptives. Gravida  2 Length (months) of breastfeeding  >24 Maternal age  74-25 Para  58  Other Problems Darden Palmer, [place]; 06/29/2020 2:33 PM) Anxiety Disorder  Arthritis  Breast Cancer  Cholelithiasis  Lump In Breast  Thyroid Disease     Review of Systems Darden Palmer RMA; 06/29/2020 2:33 PM) General Not Present- Appetite Loss, Chills, Fatigue, Fever, Night Sweats, Weight Gain and Weight Loss. Skin Present- Dryness. Not Present- Change in Wart/Mole, Hives, Jaundice, New Lesions, Non-Healing Wounds, Rash and Ulcer. HEENT Present- Seasonal Allergies and Wears glasses/contact lenses. Not Present- Earache, Hearing Loss, Hoarseness, Nose Bleed, Oral Ulcers, Ringing in the Ears, Sinus Pain, Sore Throat, Visual Disturbances and Yellow Eyes. Breast Not Present- Breast Mass, Breast Pain, Nipple Discharge and Skin Changes. Gastrointestinal Present- Constipation. Not Present- Abdominal Pain, Bloating, Bloody Stool, Change in Bowel Habits, Chronic diarrhea, Difficulty Swallowing, Excessive gas, Gets full quickly at meals, Hemorrhoids, Indigestion, Nausea, Rectal Pain and Vomiting. Female Genitourinary Not Present- Frequency, Nocturia, Painful Urination, Pelvic Pain and Urgency. Musculoskeletal Present- Joint Stiffness. Not Present- Back Pain, Joint Pain, Muscle Pain, Muscle Weakness and Swelling of Extremities. Neurological Not Present- Decreased Memory, Fainting, Headaches, Numbness, Seizures, Tingling, Tremor, Trouble walking and Weakness. Psychiatric Present- Anxiety. Not Present- Bipolar, Change in Sleep Pattern, Depression, Fearful and Frequent crying. Endocrine Present- Hot  flashes. Not Present- Cold Intolerance, Excessive Hunger, Hair Changes, Heat Intolerance and New Diabetes. Hematology Not Present- Blood Thinners, Easy Bruising, Excessive bleeding, Gland problems, HIV and Persistent Infections.  Vitals Lattie Haw [place] RMA; 06/29/2020 2:37 PM) 06/29/2020 2:36 PM Weight: 188.13 lb Height: 67in  Body Surface Area: 1.97 m Body Mass Index: 29.46 kg/m  Temp.: 74F  Pulse: 90 (Regular)  P.OX: 97% (Room air) BP: 128/82(Sitting, Left Arm, Standard)       Physical Exam (Dmonte Maher A. Dairon Procter MD; 06/29/2020 3:27 PM) General Mental Status-Alert. General Appearance-Consistent with stated age. Hydration-Well hydrated. Voice-Normal.  Chest and Lung Exam Chest and lung exam reveals -quiet, even and easy respiratory effort with no use of accessory muscles and on auscultation, normal breath sounds, no adventitious sounds and normal vocal resonance. Inspection Chest Wall - Normal. Back - normal.  Breast Note: Postradiation changes noted right breast. Right breast is slightly smaller than left. No discrete masses. Scar from previous surgery. Scar noted in right axilla. No evidence of right arm lymphedema. Left breast is normal.   Cardiovascular Cardiovascular examination reveals -normal heart sounds, regular rate and rhythm with no murmurs and normal pedal pulses bilaterally.  Neurologic Neurologic evaluation reveals -alert and oriented x 3 with no impairment of recent or remote memory. Mental Status-Normal.  Lymphatic Head & Neck  General Head & Neck Lymphatics: Bilateral - Description - Normal. Axillary  General Axillary Region: Bilateral - Description - Normal. Tenderness - Non Tender.    Assessment & Plan (Demonte Dobratz A. Amillya Chavira MD; 06/29/2020 3:28 PM) BREAST NEOPLASM, TIS (DCIS), RIGHT (D05.11) Impression:  Patient seen by plastic surgery given previous tobacco use history and radiation have recommended delayed reconstruction.   She does not wish to go to [facility] at this point time and wishes to proceed with bilateral simple mastectomy. Patient had previous right axillary lymph node dissection therefore we'll proceed with right simple mastectomy and prophylactic/risk reducing left simple mastectomy.The surgical and non surgical options have been discussed with the patient.  Risks of surgery include bleeding,  Infection,  Flap necrosis,  Tissue loss,  Chronic pain, death, Numbness,  And the need for additional procedures.  Reconstruction options also have been discussed with the patient as well.  The patient agrees to proceed.   HISTORY OF RIGHT BREAST CANCER (Z85.3)

## 2020-07-22 ENCOUNTER — Encounter (HOSPITAL_BASED_OUTPATIENT_CLINIC_OR_DEPARTMENT_OTHER): Payer: Self-pay | Admitting: Surgery

## 2020-07-22 ENCOUNTER — Other Ambulatory Visit: Payer: Self-pay

## 2020-07-26 ENCOUNTER — Other Ambulatory Visit (HOSPITAL_COMMUNITY)
Admission: RE | Admit: 2020-07-26 | Discharge: 2020-07-26 | Disposition: A | Discharge status: Home / Self Care | Payer: No Typology Code available for payment source | Source: Ambulatory Visit | Attending: Surgery | Admitting: Surgery

## 2020-07-26 ENCOUNTER — Ambulatory Visit: Payer: Self-pay | Admitting: Genetic Counselor

## 2020-07-26 ENCOUNTER — Telehealth: Payer: Self-pay | Admitting: Genetic Counselor

## 2020-07-26 ENCOUNTER — Encounter: Payer: Self-pay | Admitting: Genetic Counselor

## 2020-07-26 ENCOUNTER — Encounter (HOSPITAL_BASED_OUTPATIENT_CLINIC_OR_DEPARTMENT_OTHER)
Admission: RE | Admit: 2020-07-26 | Discharge: 2020-07-26 | Disposition: A | Discharge status: Home / Self Care | Payer: No Typology Code available for payment source | Source: Ambulatory Visit | Attending: Surgery | Admitting: Surgery

## 2020-07-26 DIAGNOSIS — Z20822 Contact with and (suspected) exposure to covid-19: Secondary | ICD-10-CM | POA: Insufficient documentation

## 2020-07-26 DIAGNOSIS — Z1379 Encounter for other screening for genetic and chromosomal anomalies: Secondary | ICD-10-CM

## 2020-07-26 DIAGNOSIS — Z01818 Encounter for other preprocedural examination: Secondary | ICD-10-CM | POA: Insufficient documentation

## 2020-07-26 LAB — CBC WITH DIFFERENTIAL/PLATELET
Abs Immature Granulocytes: 0.02 10*3/uL (ref 0.00–0.07)
Basophils Absolute: 0.1 10*3/uL (ref 0.0–0.1)
Basophils Relative: 1 %
Eosinophils Absolute: 0.2 10*3/uL (ref 0.0–0.5)
Eosinophils Relative: 3 %
HCT: 41.7 % (ref 36.0–46.0)
Hemoglobin: 13.7 g/dL (ref 12.0–15.0)
Immature Granulocytes: 0 %
Lymphocytes Relative: 41 %
Lymphs Abs: 2.1 10*3/uL (ref 0.7–4.0)
MCH: 33.1 pg (ref 26.0–34.0)
MCHC: 32.9 g/dL (ref 30.0–36.0)
MCV: 100.7 fL — ABNORMAL HIGH (ref 80.0–100.0)
Monocytes Absolute: 0.4 10*3/uL (ref 0.1–1.0)
Monocytes Relative: 8 %
Neutro Abs: 2.5 10*3/uL (ref 1.7–7.7)
Neutrophils Relative %: 47 %
Platelets: 259 10*3/uL (ref 150–400)
RBC: 4.14 MIL/uL (ref 3.87–5.11)
RDW: 12.9 % (ref 11.5–15.5)
WBC: 5.3 10*3/uL (ref 4.0–10.5)
nRBC: 0 % (ref 0.0–0.2)

## 2020-07-26 LAB — COMPREHENSIVE METABOLIC PANEL
ALT: 15 U/L (ref 0–44)
AST: 18 U/L (ref 15–41)
Albumin: 3.8 g/dL (ref 3.5–5.0)
Alkaline Phosphatase: 46 U/L (ref 38–126)
Anion gap: 11 (ref 5–15)
BUN: 20 mg/dL (ref 6–20)
CO2: 26 mmol/L (ref 22–32)
Calcium: 9.4 mg/dL (ref 8.9–10.3)
Chloride: 102 mmol/L (ref 98–111)
Creatinine, Ser: 0.65 mg/dL (ref 0.44–1.00)
GFR, Estimated: >60 mL/min (ref >60)
Glucose, Bld: 87 mg/dL (ref 70–99)
Potassium: 4.9 mmol/L (ref 3.5–5.1)
Sodium: 139 mmol/L (ref 135–145)
Total Bilirubin: 0.7 mg/dL (ref 0.3–1.2)
Total Protein: 6.7 g/dL (ref 6.5–8.1)

## 2020-07-26 NOTE — Progress Notes (Signed)
HPI:  [name] was previously seen in the [facility] due to a personal and family history of breast cancer and concerns regarding a hereditary predisposition to cancer. Please refer to our prior cancer genetics clinic note for more information regarding our discussion, assessment and recommendations, at the time. [name]'s recent genetic test results were disclosed to her, as were recommendations warranted by these results. These results and recommendations are discussed in more detail below.  CANCER HISTORY:  Oncology History   No history exists.    FAMILY HISTORY:  We obtained a detailed, 4-generation family history.  Significant diagnoses are listed below: Family History  Problem Relation Age of Onset  . Arthritis Mother 70       RA  . Alcohol abuse Father   . Hypertension Father   . COPD Father        d. 11  . Alcohol abuse Sister   . Alcohol abuse Brother   . Breast cancer Paternal Aunt        x2 dx  . Heart attack Maternal Grandfather   . Prostate cancer Paternal Grandfather        dx 47s  . Healthy Daughter     The patient has one daughter who is cancer free.  She has a brother and a sister who are cancer free.  Both parents are deceased.  The patient's father died of COPD at age 81.  He has two sisters and a brother.  One sister had breast cancer twice and currently has lung cancer.  The paternal grandparents are deceased, the grandfather had prostate cancer.  The patient's mother died from complications of rheumatoid arthritis.  She had six sisters and a brother who are cancer free. The maternal grandparents are deceased from non cancer related issues.  [name] is unaware of previous family history of genetic testing for hereditary cancer risks. Patient's maternal ancestors are of [place] descent, and paternal ancestors are of [place] descent. There is no reported Ashkenazi Jewish ancestry. There is no known  consanguinity.    GENETIC TEST RESULTS: Genetic testing reported out on July 22, 2020 through the CancerNext-Expanded+RNA cancer panel found no pathogenic mutations. The CancerNext-Expanded gene panel offered by [facility] and includes sequencing and rearrangement analysis for the following 77 genes: AIP, ALK, APC*, ATM*, AXIN2, BAP1, BARD1, BLM, BMPR1A, BRCA1*, BRCA2*, BRIP1*, CDC73, CDH1*, CDK4, CDKN1B, CDKN2A, CHEK2*, CTNNA1, DICER1, FANCC, FH, FLCN, GALNT12, KIF1B, LZTR1, MAX, MEN1, MET, MLH1*, MSH2*, MSH3, MSH6*, MUTYH*, NBN, NF1*, NF2, NTHL1, PALB2*, PHOX2B, PMS2*, POT1, PRKAR1A, PTCH1, PTEN*, RAD51C*, RAD51D*, RB1, RECQL, RET, SDHA, SDHAF2, SDHB, SDHC, SDHD, SMAD4, SMARCA4, SMARCB1, SMARCE1, STK11, SUFU, TMEM127, TP53*, TSC1, TSC2, VHL and XRCC2 (sequencing and deletion/duplication); EGFR, EGLN1, HOXB13, KIT, MITF, PDGFRA, POLD1, and POLE (sequencing only); EPCAM and GREM1 (deletion/duplication only). DNA and RNA analyses performed for * genes. The test report has been scanned into EPIC and is located under the Molecular Pathology section of the Results Review tab.  A portion of the result report is included below for reference.     We discussed with [name] that because current genetic testing is not perfect, it is possible there may be a gene mutation in one of these genes that current testing cannot detect, but that chance is small.  We also discussed, that there could be another gene that has not yet been discovered, or that we have not yet tested, that is responsible for the cancer diagnoses in the family. It is  also possible there is a hereditary cause for the cancer in the family that [name] did not inherit and therefore was not identified in her testing.  Therefore, it is important to remain in touch with cancer genetics in the future so that we can continue to offer [name] the most up to date genetic testing.   ADDITIONAL GENETIC TESTING: We discussed with Ms.  Alicia Mcconnell that her genetic testing was fairly extensive.  If there are genes identified to increase cancer risk that can be analyzed in the future, we would be happy to discuss and coordinate this testing at that time.    CANCER SCREENING RECOMMENDATIONS: [name] test result is considered negative (normal).  This means that we have not identified a hereditary cause for her personal and family history of cancer at this time. Most cancers happen by chance and this negative test suggests that her cancer may fall into this category.    While reassuring, this does not definitively rule out a hereditary predisposition to cancer. It is still possible that there could be genetic mutations that are undetectable by current technology. There could be genetic mutations in genes that have not been tested or identified to increase cancer risk.  Therefore, it is recommended she continue to follow the cancer management and screening guidelines provided by her oncology and primary healthcare provider.   An individual's cancer risk and medical management are not determined by genetic test results alone. Overall cancer risk assessment incorporates additional factors, including personal medical history, family history, and any available genetic information that may result in a personalized plan for cancer prevention and surveillance  RECOMMENDATIONS FOR FAMILY MEMBERS:  Individuals in this family might be at some increased risk of developing cancer, over the general population risk, simply due to the family history of cancer.  We recommended women in this family have a yearly mammogram beginning at age 60, or 50 years younger than the earliest onset of cancer, an annual clinical breast exam, and perform monthly breast self-exams. Women in this family should also have a gynecological exam as recommended by their primary provider. All family members should be referred for colonoscopy starting at age 44.  FOLLOW-UP:  Lastly, we discussed with [name] that cancer genetics is a rapidly advancing field and it is possible that new genetic tests will be appropriate for her and/or her family members in the future. We encouraged her to remain in contact with cancer genetics on an annual basis so we can update her personal and family histories and let her know of advances in cancer genetics that may benefit this family.   Our contact number was provided. [name]'s questions were answered to her satisfaction, and she knows she is welcome to call us at anytime with additional questions or concerns.   Roma Kayser, [place], [facility] Licensed, Certified Genetic Counselor [email or web address]

## 2020-07-26 NOTE — Progress Notes (Signed)

## 2020-07-26 NOTE — Telephone Encounter (Signed)
Revealed negative genetic testing.  Discussed that we do not know why she has breast cancer or why there is cancer in the family. It could be due to a different gene that we are not testing, or maybe our current technology may not be able to pick something up.  It will be important for her to keep in contact with genetics to keep up with whether additional testing may be needed. 

## 2020-07-27 LAB — SARS CORONAVIRUS 2 (TAT 6-24 HRS): SARS Coronavirus 2: NEGATIVE

## 2020-07-29 ENCOUNTER — Encounter (HOSPITAL_BASED_OUTPATIENT_CLINIC_OR_DEPARTMENT_OTHER): Payer: Self-pay | Admitting: Surgery

## 2020-07-29 ENCOUNTER — Encounter (HOSPITAL_BASED_OUTPATIENT_CLINIC_OR_DEPARTMENT_OTHER)
Admission: RE | Disposition: A | Discharge status: Home / Self Care | Payer: Self-pay | Source: Home / Self Care | Attending: Surgery

## 2020-07-29 ENCOUNTER — Other Ambulatory Visit: Payer: Self-pay

## 2020-07-29 ENCOUNTER — Ambulatory Visit (HOSPITAL_BASED_OUTPATIENT_CLINIC_OR_DEPARTMENT_OTHER): Payer: No Typology Code available for payment source | Admitting: Anesthesiology

## 2020-07-29 ENCOUNTER — Ambulatory Visit (HOSPITAL_BASED_OUTPATIENT_CLINIC_OR_DEPARTMENT_OTHER)
Admission: RE | Admit: 2020-07-29 | Discharge: 2020-07-30 | Disposition: A | Discharge status: Home / Self Care | Payer: No Typology Code available for payment source | Attending: Surgery | Admitting: Surgery

## 2020-07-29 DIAGNOSIS — Z923 Personal history of irradiation: Secondary | ICD-10-CM | POA: Diagnosis not present

## 2020-07-29 DIAGNOSIS — Z8042 Family history of malignant neoplasm of prostate: Secondary | ICD-10-CM | POA: Insufficient documentation

## 2020-07-29 DIAGNOSIS — Z8249 Family history of ischemic heart disease and other diseases of the circulatory system: Secondary | ICD-10-CM | POA: Diagnosis not present

## 2020-07-29 DIAGNOSIS — Z836 Family history of other diseases of the respiratory system: Secondary | ICD-10-CM | POA: Diagnosis not present

## 2020-07-29 DIAGNOSIS — Z8261 Family history of arthritis: Secondary | ICD-10-CM | POA: Insufficient documentation

## 2020-07-29 DIAGNOSIS — Z79899 Other long term (current) drug therapy: Secondary | ICD-10-CM | POA: Insufficient documentation

## 2020-07-29 DIAGNOSIS — D0511 Intraductal carcinoma in situ of right breast: Secondary | ICD-10-CM | POA: Insufficient documentation

## 2020-07-29 DIAGNOSIS — Z853 Personal history of malignant neoplasm of breast: Secondary | ICD-10-CM | POA: Insufficient documentation

## 2020-07-29 DIAGNOSIS — C50411 Malignant neoplasm of upper-outer quadrant of right female breast: Secondary | ICD-10-CM | POA: Diagnosis present

## 2020-07-29 DIAGNOSIS — Z811 Family history of alcohol abuse and dependence: Secondary | ICD-10-CM | POA: Insufficient documentation

## 2020-07-29 DIAGNOSIS — Z87891 Personal history of nicotine dependence: Secondary | ICD-10-CM | POA: Insufficient documentation

## 2020-07-29 DIAGNOSIS — Z818 Family history of other mental and behavioral disorders: Secondary | ICD-10-CM | POA: Insufficient documentation

## 2020-07-29 DIAGNOSIS — Z170 Estrogen receptor positive status [ER+]: Secondary | ICD-10-CM | POA: Diagnosis not present

## 2020-07-29 DIAGNOSIS — Z888 Allergy status to other drugs, medicaments and biological substances status: Secondary | ICD-10-CM | POA: Insufficient documentation

## 2020-07-29 HISTORY — DX: Hypothyroidism, unspecified: E03.9

## 2020-07-29 HISTORY — DX: Unspecified osteoarthritis, unspecified site: M19.90

## 2020-07-29 HISTORY — PX: SIMPLE MASTECTOMY WITH AXILLARY SENTINEL NODE BIOPSY: SHX6098

## 2020-07-29 SURGERY — SIMPLE MASTECTOMY
Anesthesia: General | Site: Breast | Laterality: Bilateral

## 2020-07-29 MED ORDER — GABAPENTIN 300 MG PO CAPS
ORAL_CAPSULE | ORAL | Status: AC
  Filled 2020-07-29: qty 1

## 2020-07-29 MED ORDER — PROPOFOL 500 MG/50ML IV EMUL
INTRAVENOUS | Status: AC
  Filled 2020-07-29: qty 50

## 2020-07-29 MED ORDER — MEPERIDINE HCL 25 MG/ML IJ SOLN
6.2500 mg | INTRAMUSCULAR | Status: DC | PRN
Start: 2020-07-29 — End: 2020-07-30

## 2020-07-29 MED ORDER — GABAPENTIN 100 MG PO CAPS
200.0000 mg | ORAL_CAPSULE | Freq: Every day | ORAL | Status: DC
  Administered 2020-07-29: 200 mg via ORAL

## 2020-07-29 MED ORDER — CHLORHEXIDINE GLUCONATE CLOTH 2 % EX PADS: 6.0000 | MEDICATED_PAD | Freq: Once | CUTANEOUS | Status: DC

## 2020-07-29 MED ORDER — SODIUM CHLORIDE 0.9 % IV SOLN: 250.0000 mL | INTRAVENOUS | Status: DC | PRN

## 2020-07-29 MED ORDER — OXYCODONE HCL 5 MG/5ML PO SOLN: 5.0000 mg | Freq: Once | ORAL | Status: DC | PRN

## 2020-07-29 MED ORDER — OXYCODONE HCL 5 MG PO TABS: 5.0000 mg | ORAL_TABLET | Freq: Once | ORAL | Status: DC | PRN

## 2020-07-29 MED ORDER — CEFAZOLIN SODIUM-DEXTROSE 2-4 GM/100ML-% IV SOLN
2.0000 g | INTRAVENOUS | Status: AC
  Administered 2020-07-29: 2 g via INTRAVENOUS

## 2020-07-29 MED ORDER — LIDOCAINE 2% (20 MG/ML) 5 ML SYRINGE
INTRAMUSCULAR | Status: AC
  Filled 2020-07-29: qty 5

## 2020-07-29 MED ORDER — ACETAMINOPHEN 325 MG PO TABS
325.0000 mg | ORAL_TABLET | ORAL | Status: DC | PRN
  Filled 2020-07-29 (×2): qty 2

## 2020-07-29 MED ORDER — ACETAMINOPHEN 500 MG PO TABS
1000.0000 mg | ORAL_TABLET | ORAL | Status: AC
  Administered 2020-07-29: 1000 mg via ORAL

## 2020-07-29 MED ORDER — SODIUM CHLORIDE 0.9% FLUSH: 3.0000 mL | INTRAVENOUS | Status: DC | PRN

## 2020-07-29 MED ORDER — FENTANYL CITRATE (PF) 100 MCG/2ML IJ SOLN
50.0000 ug | Freq: Once | INTRAMUSCULAR | Status: AC
  Administered 2020-07-29: 100 ug via INTRAVENOUS

## 2020-07-29 MED ORDER — ONDANSETRON HCL 4 MG/2ML IJ SOLN
INTRAMUSCULAR | Status: DC | PRN
  Administered 2020-07-29: 4 mg via INTRAVENOUS

## 2020-07-29 MED ORDER — GABAPENTIN 300 MG PO CAPS
300.0000 mg | ORAL_CAPSULE | ORAL | Status: AC
  Administered 2020-07-29: 300 mg via ORAL

## 2020-07-29 MED ORDER — LIDOCAINE 2% (20 MG/ML) 5 ML SYRINGE
INTRAMUSCULAR | Status: DC | PRN
  Administered 2020-07-29: 40 mg via INTRAVENOUS

## 2020-07-29 MED ORDER — PROPOFOL 10 MG/ML IV BOLUS
INTRAVENOUS | Status: DC | PRN
  Administered 2020-07-29: 150 mg via INTRAVENOUS
  Administered 2020-07-29: 50 mg via INTRAVENOUS

## 2020-07-29 MED ORDER — OXYCODONE HCL 5 MG PO TABS: 5.0000 mg | ORAL_TABLET | Freq: Four times a day (QID) | ORAL | 0 refills | Status: DC | PRN

## 2020-07-29 MED ORDER — MORPHINE SULFATE (PF) 4 MG/ML IV SOLN: 1.0000 mg | INTRAVENOUS | Status: DC | PRN

## 2020-07-29 MED ORDER — FENTANYL CITRATE (PF) 100 MCG/2ML IJ SOLN
INTRAMUSCULAR | Status: AC
  Filled 2020-07-29: qty 2

## 2020-07-29 MED ORDER — CEFAZOLIN SODIUM-DEXTROSE 2-4 GM/100ML-% IV SOLN
INTRAVENOUS | Status: AC
  Filled 2020-07-29: qty 100

## 2020-07-29 MED ORDER — ACETAMINOPHEN 325 MG RE SUPP
650.0000 mg | RECTAL | Status: DC | PRN
Start: 2020-07-29 — End: 2020-07-30

## 2020-07-29 MED ORDER — ACETAMINOPHEN 160 MG/5ML PO SOLN: 325.0000 mg | ORAL | Status: DC | PRN

## 2020-07-29 MED ORDER — FENTANYL CITRATE (PF) 100 MCG/2ML IJ SOLN
25.0000 ug | INTRAMUSCULAR | Status: DC | PRN
  Administered 2020-07-29: 25 ug via INTRAVENOUS
  Administered 2020-07-29: 50 ug via INTRAVENOUS
  Administered 2020-07-29: 25 ug via INTRAVENOUS

## 2020-07-29 MED ORDER — MIDAZOLAM HCL 2 MG/2ML IJ SOLN
1.0000 mg | Freq: Once | INTRAMUSCULAR | Status: AC
  Administered 2020-07-29: 2 mg via INTRAVENOUS

## 2020-07-29 MED ORDER — GABAPENTIN 100 MG PO CAPS
ORAL_CAPSULE | ORAL | Status: AC
  Filled 2020-07-29: qty 2

## 2020-07-29 MED ORDER — ONDANSETRON HCL 4 MG/2ML IJ SOLN: 4.0000 mg | Freq: Once | INTRAMUSCULAR | Status: DC | PRN

## 2020-07-29 MED ORDER — ACETAMINOPHEN 500 MG PO TABS
ORAL_TABLET | ORAL | Status: AC
  Filled 2020-07-29: qty 2

## 2020-07-29 MED ORDER — ACETAMINOPHEN 325 MG PO TABS
650.0000 mg | ORAL_TABLET | ORAL | Status: DC | PRN
  Administered 2020-07-29 – 2020-07-30 (×2): 650 mg via ORAL

## 2020-07-29 MED ORDER — IBUPROFEN 800 MG PO TABS: 800.0000 mg | ORAL_TABLET | Freq: Three times a day (TID) | ORAL | 0 refills | Status: DC | PRN

## 2020-07-29 MED ORDER — MIDAZOLAM HCL 2 MG/2ML IJ SOLN
INTRAMUSCULAR | Status: AC
  Filled 2020-07-29: qty 2

## 2020-07-29 MED ORDER — PROPOFOL 10 MG/ML IV BOLUS
INTRAVENOUS | Status: AC
  Filled 2020-07-29: qty 20

## 2020-07-29 MED ORDER — SODIUM CHLORIDE 0.9% FLUSH: 3.0000 mL | Freq: Two times a day (BID) | INTRAVENOUS | Status: DC

## 2020-07-29 MED ORDER — ONDANSETRON HCL 4 MG/2ML IJ SOLN: 4.0000 mg | INTRAMUSCULAR | Status: DC | PRN

## 2020-07-29 MED ORDER — CYCLOBENZAPRINE HCL 10 MG PO TABS
5.0000 mg | ORAL_TABLET | Freq: Three times a day (TID) | ORAL | Status: DC | PRN
  Administered 2020-07-29 – 2020-07-30 (×2): 5 mg via ORAL
  Filled 2020-07-29 (×2): qty 1

## 2020-07-29 MED ORDER — DEXAMETHASONE SODIUM PHOSPHATE 4 MG/ML IJ SOLN
INTRAMUSCULAR | Status: DC | PRN
  Administered 2020-07-29: 5 mg via INTRAVENOUS

## 2020-07-29 MED ORDER — EPHEDRINE SULFATE 50 MG/ML IJ SOLN
INTRAMUSCULAR | Status: DC | PRN
  Administered 2020-07-29 (×2): 10 mg via INTRAVENOUS

## 2020-07-29 MED ORDER — DEXAMETHASONE SODIUM PHOSPHATE 10 MG/ML IJ SOLN
INTRAMUSCULAR | Status: AC
  Filled 2020-07-29: qty 1

## 2020-07-29 MED ORDER — OXYCODONE HCL 5 MG PO TABS
5.0000 mg | ORAL_TABLET | ORAL | Status: DC | PRN
  Administered 2020-07-29 (×2): 5 mg via ORAL
  Filled 2020-07-29 (×2): qty 1

## 2020-07-29 MED ORDER — FENTANYL CITRATE (PF) 100 MCG/2ML IJ SOLN: 25.0000 ug | INTRAMUSCULAR | Status: DC | PRN

## 2020-07-29 MED ORDER — LACTATED RINGERS IV SOLN: INTRAVENOUS | Status: DC

## 2020-07-29 MED ORDER — PROPOFOL 500 MG/50ML IV EMUL
INTRAVENOUS | Status: DC | PRN
  Administered 2020-07-29: 50 ug/kg/min via INTRAVENOUS

## 2020-07-29 MED ORDER — FENTANYL CITRATE (PF) 100 MCG/2ML IJ SOLN
INTRAMUSCULAR | Status: DC | PRN
  Administered 2020-07-29: 50 ug via INTRAVENOUS
  Administered 2020-07-29 (×2): 25 ug via INTRAVENOUS
  Administered 2020-07-29: 50 ug via INTRAVENOUS
  Administered 2020-07-29 (×2): 25 ug via INTRAVENOUS

## 2020-07-29 MED ORDER — ONDANSETRON HCL 4 MG/2ML IJ SOLN
INTRAMUSCULAR | Status: AC
  Filled 2020-07-29: qty 2

## 2020-07-29 SURGICAL SUPPLY — 67 items
APL PRP STRL LF DISP 70% ISPRP (MISCELLANEOUS) ×1
APPLIER CLIP 11 MED OPEN (CLIP)
APPLIER CLIP 9.375 MED OPEN (MISCELLANEOUS) ×2
BENZOIN TINCTURE PRP APPL 2/3 (GAUZE/BANDAGES/DRESSINGS) IMPLANT
BINDER BREAST LRG (GAUZE/BANDAGES/DRESSINGS) IMPLANT
BINDER BREAST MEDIUM (GAUZE/BANDAGES/DRESSINGS) IMPLANT
BINDER BREAST XLRG (GAUZE/BANDAGES/DRESSINGS) IMPLANT
BINDER BREAST XXLRG (GAUZE/BANDAGES/DRESSINGS) IMPLANT
BLADE HEX COATED 2.75 (ELECTRODE) ×2 IMPLANT
BLADE SURG 10 STRL SS (BLADE) ×2 IMPLANT
BLADE SURG 15 STRL LF DISP TIS (BLADE) ×1 IMPLANT
BLADE SURG 15 STRL SS (BLADE) ×2
BNDG ELASTIC 6X5.8 VLCR STR LF (GAUZE/BANDAGES/DRESSINGS) ×2 IMPLANT
CANISTER SUCT 1200ML W/VALVE (MISCELLANEOUS) ×2 IMPLANT
CHLORAPREP W/TINT 26 (MISCELLANEOUS) ×2 IMPLANT
CLIP APPLIE 11 MED OPEN (CLIP) IMPLANT
CLIP APPLIE 9.375 MED OPEN (MISCELLANEOUS) ×1 IMPLANT
COVER BACK TABLE 60X90IN (DRAPES) ×2 IMPLANT
COVER MAYO STAND STRL (DRAPES) ×2 IMPLANT
COVER PROBE W GEL 5X96 (DRAPES) ×2 IMPLANT
COVER WAND RF STERILE (DRAPES) IMPLANT
DECANTER SPIKE VIAL GLASS SM (MISCELLANEOUS) IMPLANT
DERMABOND ADVANCED (GAUZE/BANDAGES/DRESSINGS) ×2
DERMABOND ADVANCED .7 DNX12 (GAUZE/BANDAGES/DRESSINGS) ×2 IMPLANT
DRAIN CHANNEL 19F RND (DRAIN) ×2 IMPLANT
DRAPE LAPAROSCOPIC ABDOMINAL (DRAPES) ×2 IMPLANT
DRAPE UTILITY XL STRL (DRAPES) ×2 IMPLANT
DRSG PAD ABDOMINAL 8X10 ST (GAUZE/BANDAGES/DRESSINGS) IMPLANT
DRSG TEGADERM 4X10 (GAUZE/BANDAGES/DRESSINGS) ×2 IMPLANT
ELECT COATED BLADE 2.86 ST (ELECTRODE) ×2 IMPLANT
ELECT REM PT RETURN 9FT ADLT (ELECTROSURGICAL) ×2
ELECTRODE REM PT RTRN 9FT ADLT (ELECTROSURGICAL) ×1 IMPLANT
EVACUATOR SILICONE 100CC (DRAIN) ×2 IMPLANT
GAUZE SPONGE 4X4 12PLY STRL LF (GAUZE/BANDAGES/DRESSINGS) IMPLANT
GLOVE ECLIPSE 8.0 STRL XLNG CF (GLOVE) ×2 IMPLANT
GLOVE SRG 8 PF TXTR STRL LF DI (GLOVE) ×1 IMPLANT
GLOVE SURG UNDER POLY LF SZ8 (GLOVE) ×2
GOWN STRL REUS W/ TWL LRG LVL3 (GOWN DISPOSABLE) ×2 IMPLANT
GOWN STRL REUS W/ TWL XL LVL3 (GOWN DISPOSABLE) ×1 IMPLANT
GOWN STRL REUS W/TWL LRG LVL3 (GOWN DISPOSABLE) ×4
GOWN STRL REUS W/TWL XL LVL3 (GOWN DISPOSABLE) ×2
HEMOSTAT ARISTA ABSORB 3G PWDR (HEMOSTASIS) ×8 IMPLANT
NDL SAFETY ECLIPSE 18X1.5 (NEEDLE) ×1 IMPLANT
NEEDLE HYPO 18GX1.5 SHARP (NEEDLE) ×2
NEEDLE HYPO 25X1 1.5 SAFETY (NEEDLE) ×4 IMPLANT
NS IRRIG 1000ML POUR BTL (IV SOLUTION) ×2 IMPLANT
PACK BASIN DAY SURGERY FS (CUSTOM PROCEDURE TRAY) ×2 IMPLANT
PENCIL SMOKE EVACUATOR (MISCELLANEOUS) ×2 IMPLANT
PIN SAFETY STERILE (MISCELLANEOUS) IMPLANT
SLEEVE SCD COMPRESS KNEE MED (MISCELLANEOUS) ×2 IMPLANT
SPONGE LAP 18X18 RF (DISPOSABLE) ×2 IMPLANT
SPONGE LAP 4X18 RFD (DISPOSABLE) IMPLANT
STAPLER VISISTAT 35W (STAPLE) ×2 IMPLANT
STRIP CLOSURE SKIN 1/2X4 (GAUZE/BANDAGES/DRESSINGS) IMPLANT
SUT CHROMIC 3 0 SH 27 (SUTURE) IMPLANT
SUT ETHILON 2 0 FS 18 (SUTURE) ×6 IMPLANT
SUT MNCRL AB 3-0 PS2 18 (SUTURE) ×2 IMPLANT
SUT MON AB 4-0 PC3 18 (SUTURE) ×2 IMPLANT
SUT SILK 2 0 PERMA HAND 18 BK (SUTURE) ×2 IMPLANT
SUT VIC AB 3-0 54X BRD REEL (SUTURE) IMPLANT
SUT VIC AB 3-0 BRD 54 (SUTURE)
SUT VICRYL 3-0 CR8 SH (SUTURE) ×4 IMPLANT
SYR CONTROL 10ML LL (SYRINGE) ×4 IMPLANT
TOWEL GREEN STERILE FF (TOWEL DISPOSABLE) ×4 IMPLANT
TRAY FAXITRON CT DISP (TRAY / TRAY PROCEDURE) ×2 IMPLANT
TUBE CONNECTING 20X1/4 (TUBING) ×2 IMPLANT
YANKAUER SUCT BULB TIP NO VENT (SUCTIONS) ×2 IMPLANT

## 2020-07-29 NOTE — Addendum Note (Signed)
Addendum  created 07/29/20 1649 by Kipp Brood, MD   Child order released for a procedure order, Clinical Note Signed, Image imported, Intraprocedure Blocks edited

## 2020-07-29 NOTE — Anesthesia Procedure Notes (Addendum)
Anesthesia Regional Block: Pectoralis block   Pre-Anesthetic Checklist: ,, timeout performed, Correct Patient, Correct Site, Correct Laterality, Correct Procedure, Correct Position, site marked, Risks and benefits discussed,  Surgical consent,  Pre-op evaluation,  At surgeon's request and post-op pain management  Laterality: Right  Prep: chloraprep       Needles:  Injection technique: Single-shot  Needle Type: Echogenic Needle     Needle Length: 9cm  Needle Gauge: 21     Additional Needles:   Narrative:  Start time: 07/29/2020 11:35 AM End time: 07/29/2020 11:40 AM Injection made incrementally with aspirations every 5 mL.  Performed by: Personally  Anesthesiologist: Kipp Brood, MD  Additional Notes: 25 cc 0.25% Bupivacaine 1:200 epi 10 cc 1.3% Exparel

## 2020-07-29 NOTE — Op Note (Signed)
Preoperative diagnosis: Right breast cancer with history of right breast cancer  Postoperative diagnosis: Stage I right breast cancer upper outer quadrant  Procedure: Right simple mastectomy with left risk reducing mastectomy  Surgeon: Erroll Luna, MD  Anesthesia: General with pectoral block  EBL: 40 cc  Drains: 119 round drain to each mastectomy site  Specimen: Bilateral breast tissue to pathology  IV fluids: Per anesthesia record  Indications for procedure: The patient is a 60 year old female who has a history of right breast cancer in 1996.  She underwent right breast lumpectomy, radiation therapy, chemotherapy and axillary lymph node dissection.  She presents with a mammographic abnormality on the right.  A lesion was noted right breast upper outer quadrant core biopsy proven to be invasive mammary carcinoma.  We discussed options of standards of care.  We discussed the role of lumpectomy sometimes plays even after radiation therapy.  Given her relatively young age she opted for bilateral simple mastectomy.  She also has an increased lifetime risk of breast cancer which prompted her decision to undergo prophylactic left mastectomy.  She plans on doing reconstruction later time and is seeing plastic surgery for that.The surgical and non surgical options have been discussed with the patient.  Risks of surgery include bleeding,  Infection,  Flap necrosis,  Tissue loss,  Chronic pain, death, Numbness,  And the need for additional procedures.  Reconstruction options also have been discussed with the patient as well.  The patient agrees to proceed.    Description of procedure: The patient was met in the holding area and questions were answered.  I reviewed the case as well as procedure with her and answer any questions she had.  She underwent pectoral block per anesthesia.  She was then brought back to the operative room.  She was placed supine upon the OR table.  After induction of general  anesthesia both breasts were prepped and draped in sterile fashion and timeout performed.  Proper patient, site and procedures were verified.  She received appropriate antibiotic coverage.  The left side was done first.  A curvilinear incision was made above and below the nipple areolar complex.  A superior skin flap was then taken up to the clavicle, to the midline sternum, to the inframammary fold into the lateral attachments circumferentially.  The breast was then dissected in a medial to lateral fashion of the pectoralis fascia until the lateral attachments were identified and divided.  Hemostasis was achieved with cautery.  Arista was placed throughout the cavity.  Prior to this irrigation was used and no signs of any active bleeding.  Through a separate stab incision a 19 round drain was placed in the mastectomy bed.  Reexamination showed no evidence of any bleeding.  We then secured the drain to the skin with 2-0 nylon.  Wound was then closed with a deep layer 3-0 Vicryl and 4-0 Monocryl.  In a similar fashion the right mastectomy was performed.  A curvilinear incision was made above below the nipple areolar complex.  The superior skin flap was taken to the clavicle, sternum medially, and inferior mammary fold inferiorly.  Lateral attachments were also dissected out on the lateral portion of the breast.  There is significant scarring from previous radiation surgical treatments.  Care was taken to preserve the blood supply to the skin flaps bilaterally.  We then dissected the breast the medial to lateral fashion of the chest wall into the lateral attachments were encountered and divided.  Hemostasis achieved with cautery and  Arista.  Irrigation was placed and suctioned out prior to placement of Arista.  There is no evidence of any active bleeding.  Through separate stab incision 19 round drain was placed the inferior flap and secured the skin with 2-0 nylon.  This was placed to bulb suction.  The incision  was then closed with a deep layer 3-0 Vicryl and 4 Monocryl.  Dermabond was applied.  Breast binder placed.  All counts were found to be correct.  The patient was awoke extubated taken to recovery in satisfactory condition.

## 2020-07-29 NOTE — Interval H&P Note (Signed)
History and Physical Interval Note:  07/29/2020 11:40 AM  Alicia Mcconnell  has presented today for surgery, with the diagnosis of DUCTAL CARCINOMA IN SITU RIGHT BREAST.  The various methods of treatment have been discussed with the patient and family. After consideration of risks, benefits and other options for treatment, the patient has consented to  Procedure(s) with comments: BILATERAL SIMPLE MASTECTOMY (Bilateral) - PECTORAL BLOCK, RNFA as a surgical intervention.  The patient's history has been reviewed, patient examined, no change in status, stable for surgery.  I have reviewed the patient's chart and labs.  Questions were answered to the patient's satisfaction.     Dortha Schwalbe MD

## 2020-07-29 NOTE — Transfer of Care (Signed)
Immediate Anesthesia Transfer of Care Note  Patient: Alicia Mcconnell  Procedure(s) Performed: BILATERAL SIMPLE MASTECTOMY (Bilateral Breast)  Patient Location: PACU  Anesthesia Type:GA combined with regional for post-op pain  Level of Consciousness: sedated  Airway & Oxygen Therapy: Patient Spontanous Breathing and Patient connected to face mask oxygen  Post-op Assessment: Report given to RN and Post -op Vital signs reviewed and stable  Post vital signs: Reviewed and stable  Last Vitals:  Vitals Value Taken Time  BP 110/64 07/29/20 1434  Temp    Pulse 66 07/29/20 1437  Resp 11 07/29/20 1437  SpO2 100 % 07/29/20 1437  Vitals shown include unvalidated device data.  Last Pain:  Vitals:   07/29/20 1052  PainSc: 0-No pain      Patients Stated Pain Goal: 3 (07/29/20 1052)  Complications: No complications documented.

## 2020-07-29 NOTE — Anesthesia Postprocedure Evaluation (Signed)
Anesthesia Post Note  Patient: Alicia Mcconnell  Procedure(s) Performed: BILATERAL SIMPLE MASTECTOMY (Bilateral Breast)     Patient location during evaluation: PACU Anesthesia Type: General Level of consciousness: awake and alert Pain management: pain level controlled Vital Signs Assessment: post-procedure vital signs reviewed and stable Respiratory status: spontaneous breathing, nonlabored ventilation, respiratory function stable and patient connected to nasal cannula oxygen Cardiovascular status: blood pressure returned to baseline and stable Postop Assessment: no apparent nausea or vomiting Anesthetic complications: no   No complications documented.  Last Vitals:  Vitals:   07/29/20 1515 07/29/20 1525  BP: 108/67   Pulse: 66 72  Resp: (!) 9 12  Temp:    SpO2: 96% 96%    Last Pain:  Vitals:   07/29/20 1434  PainSc: Asleep                 Ajeenah Heiny COKER

## 2020-07-29 NOTE — Anesthesia Preprocedure Evaluation (Signed)
Anesthesia Evaluation  Patient identified by MRN, date of birth, ID band Patient awake    Reviewed: Allergy & Precautions, NPO status , Patient's Chart, lab work & pertinent test results  Airway Mallampati: II  TM Distance: >3 FB Neck ROM: Full    Dental  (+) Teeth Intact, Dental Advisory Given   Pulmonary former smoker,    breath sounds clear to auscultation       Cardiovascular  Rhythm:Regular Rate:Normal     Neuro/Psych    GI/Hepatic   Endo/Other    Renal/GU      Musculoskeletal   Abdominal   Peds  Hematology   Anesthesia Other Findings   Reproductive/Obstetrics                             Anesthesia Physical Anesthesia Plan  ASA: II  Anesthesia Plan: General and Regional   Post-op Pain Management:    Induction: Intravenous  PONV Risk Score and Plan: Ondansetron and Dexamethasone  Airway Management Planned: Oral ETT  Additional Equipment:   Intra-op Plan:   Post-operative Plan:   Informed Consent: I have reviewed the patients History and Physical, chart, labs and discussed the procedure including the risks, benefits and alternatives for the proposed anesthesia with the patient or authorized representative who has indicated his/her understanding and acceptance.     Dental advisory given  Plan Discussed with: Anesthesiologist and CRNA  Anesthesia Plan Comments:         Anesthesia Quick Evaluation

## 2020-07-29 NOTE — Anesthesia Procedure Notes (Signed)
Procedure Name: LMA Insertion Date/Time: 07/29/2020 12:21 PM Performed by: Burna Cash, CRNA Pre-anesthesia Checklist: Patient identified, Emergency Drugs available, Suction available and Patient being monitored Patient Re-evaluated:Patient Re-evaluated prior to induction Oxygen Delivery Method: Circle system utilized Preoxygenation: Pre-oxygenation with 100% oxygen Induction Type: IV induction Ventilation: Mask ventilation without difficulty LMA: LMA inserted LMA Size: 4.0 Number of attempts: 1 Airway Equipment and Method: Bite block Placement Confirmation: positive ETCO2 Tube secured with: Tape Dental Injury: Teeth and Oropharynx as per pre-operative assessment

## 2020-07-29 NOTE — Progress Notes (Signed)
Assisted Dr. Joslin with right, left, ultrasound guided, pectoralis block. Side rails up, monitors on throughout procedure. See vital signs in flow sheet. Tolerated Procedure well. 

## 2020-07-29 NOTE — Anesthesia Procedure Notes (Signed)
Anesthesia Regional Block: Pectoralis block   Pre-Anesthetic Checklist: ,, timeout performed, Correct Patient, Correct Site, Correct Laterality, Correct Procedure, Correct Position, site marked, Risks and benefits discussed,  Surgical consent,  Pre-op evaluation,  At surgeon's request and post-op pain management  Laterality: Left  Prep: chloraprep       Needles:  Injection technique: Single-shot  Needle Type: Echogenic Needle     Needle Length: 9cm  Needle Gauge: 21     Additional Needles:   Narrative:  Start time: 07/29/2020 11:40 AM End time: 07/29/2020 11:45 AM Injection made incrementally with aspirations every 5 mL.  Performed by: Personally  Anesthesiologist: Kipp Brood, MD  Additional Notes: 25 cc 0.25% Bupivacaine 1:200 epi 10 cc 1.3% Exparel

## 2020-07-30 ENCOUNTER — Encounter (HOSPITAL_BASED_OUTPATIENT_CLINIC_OR_DEPARTMENT_OTHER): Payer: Self-pay | Admitting: Surgery

## 2020-07-30 DIAGNOSIS — D0511 Intraductal carcinoma in situ of right breast: Secondary | ICD-10-CM | POA: Diagnosis not present

## 2020-07-30 NOTE — Discharge Summary (Signed)
Physician Discharge Summary  [name] MRN: [id number] DOB/AGE: 02/07/1961 60 y.o.  Admit date: 07/29/2020 Discharge date: 07/30/2020  Admission Diagnoses: right breast cancer stage 1   Discharge Diagnoses:  Same   Discharged Condition: good   Hospital Course: Pt admitted for bilateral  Simple mastectomy for right breast cancer.  She had a hx of right breast cancer in 1996 and had breast conservation. She desired bilateral mastectomy. She did well post op.  She had good pain control and tolerated diet and pain medications and ambulated.        Treatments: surgery: bilateral simple mastectomy   Discharge Exam: Blood pressure 103/72, pulse 66, temperature 97.6 F (36.4 C), resp. rate 16, height 5\' 7"  (1.702 m), weight 88.8 kg, SpO2 98 %. General appearance: alert and cooperative Resp: clear to auscultation bilaterally Cardio: normal apical impulse Incision/Wound:flaps viable   CDI no hematoma JP serous   Disposition: Discharge disposition: 01-Home or Self Care       Discharge Instructions    Diet - low sodium heart healthy   Complete by: As directed    Increase activity slowly   Complete by: As directed         Signed: Joyice Faster Evelean Bigler 07/30/2020, 9:09 AM

## 2020-07-30 NOTE — Discharge Instructions (Signed)
[facility] [phone]  MASTECTOMY: POST OP INSTRUCTIONS  Always review your discharge instruction sheet given to you by the facility where your surgery was performed. IF YOU HAVE DISABILITY OR FAMILY LEAVE FORMS, YOU MUST BRING THEM TO THE OFFICE FOR PROCESSING.   DO NOT GIVE THEM TO YOUR DOCTOR. A prescription for pain medication may be given to you upon discharge.  Take your pain medication as prescribed, if needed.  If narcotic pain medicine is not needed, then you may take acetaminophen (Tylenol) or ibuprofen (Advil) as needed. 1. Take your usually prescribed medications unless otherwise directed. *You had tylenol at 7:55 am and 5mg  flexeril at 7:55 am 2. If you need a refill on your pain medication, please contact your pharmacy.  They will contact our office to request authorization.  Prescriptions will not be filled after 5pm or on week-ends. 3. You should follow a light diet the first few days after arrival home, such as soup and crackers, etc.  Resume your normal diet the day after surgery. 4. Most patients will experience some swelling and bruising on the chest and underarm.  Ice packs will help.  Swelling and bruising can take several days to resolve.  5. It is common to experience some constipation if taking pain medication after surgery.  Increasing fluid intake and taking a stool softener (such as Colace) will usually help or prevent this problem from occurring.  A mild laxative (Milk of Magnesia or Miralax) should be taken according to package instructions if there are no bowel movements after 48 hours. 6. Unless discharge instructions indicate otherwise, leave your bandage dry and in place until your next appointment in 3-5 days.  You may take a limited sponge bath.  No tube baths or showers until the drains are removed.  You may have steri-strips (small skin tapes) in place directly over the incision.  These strips should be left on the skin for 7-10 days.  If  your surgeon used skin glue on the incision, you may shower in 24 hours.  The glue will flake off over the next 2-3 weeks.  Any sutures or staples will be removed at the office during your follow-up visit. 7. DRAINS:  If you have drains in place, it is important to keep a list of the amount of drainage produced each day in your drains.  Before leaving the hospital, you should be instructed on drain care.  Call our office if you have any questions about your drains. 8. ACTIVITIES:  You may resume regular (light) daily activities beginning the next day--such as daily self-care, walking, climbing stairs--gradually increasing activities as tolerated.  You may have sexual intercourse when it is comfortable.  Refrain from any heavy lifting or straining until approved by your doctor. a. You may drive when you are no longer taking prescription pain medication, you can comfortably wear a seatbelt, and you can safely maneuver your car and apply brakes. b. RETURN TO WORK:  __________________________________________________________ 9. You should see your doctor in the office for a follow-up appointment approximately 3-5 days after your surgery.  Your doctor's nurse will typically make your follow-up appointment when she calls you with your pathology report.  Expect your pathology report 2-3 business days after your surgery.  You may call to check if you do not hear from [place] after three days.   10. OTHER INSTRUCTIONS: ______________________________________________________________________________________________ ____________________________________________________________________________________________ WHEN TO CALL YOUR DOCTOR: 1. Fever over 101.0 2. Nausea and/or vomiting 3. Extreme swelling or bruising 4. Continued  bleeding from incision. 5. Increased pain, redness, or drainage from the incision. The clinic staff is available to answer your questions during regular business hours.  Please don't hesitate to call and  ask to speak to one of the nurses for clinical concerns.  If you have a medical emergency, go to the nearest emergency room or call 911.  A surgeon from [facility] is always on call at the hospital. [address], [place], [place], [place]  [address] ? [address], [place], [place]   [address] [phone] ? [phone] ? FAX [phone] Web site: [place] Instructions  Activity: Get plenty of rest for the remainder of the day. A responsible individual must stay with you for 24 hours following the procedure.  For the next 24 hours, DO NOT: -Drive a car -Paediatric nurse -Drink alcoholic beverages -Take any medication unless instructed by your physician -Make any legal decisions or sign important papers.  Meals: Start with liquid foods such as gelatin or soup. Progress to regular foods as tolerated. Avoid greasy, spicy, heavy foods. If nausea and/or vomiting occur, drink only clear liquids until the nausea and/or vomiting subsides. Call your physician if vomiting continues.  Special Instructions/Symptoms: Your throat may feel dry or sore from the anesthesia or the breathing tube placed in your throat during surgery. If this causes discomfort, gargle with warm salt water. The discomfort should disappear within 24 hours.  If you had a scopolamine patch placed behind your ear for the management of post- operative nausea and/or vomiting:  1. The medication in the patch is effective for 72 hours, after which it should be removed.  Wrap patch in a tissue and discard in the trash. Wash hands thoroughly with soap and water. 2. You may remove the patch earlier than 72 hours if you experience unpleasant side effects which may include dry mouth, dizziness or visual disturbances. 3. Avoid touching the patch. Wash your hands with soap and water after contact with the patch.        JP Drain [employer] this sheet to all of your post-operative  appointments while you have your drains.  Please measure your drains by CC's or ML's.  Make sure you drain and measure your JP Drains 2 or 3 times per day.  At the end of each day, add up totals for the left side and add up totals for the right side.    ( 9 am )     ( 3 pm )        ( 9 pm )                Date L  R  L  R  L  R  Total L/R

## 2020-08-02 LAB — SURGICAL PATHOLOGY

## 2020-08-11 ENCOUNTER — Encounter: Payer: Self-pay | Admitting: Family Medicine

## 2020-08-11 ENCOUNTER — Inpatient Hospital Stay (HOSPITAL_COMMUNITY)
Admission: AD | Admit: 2020-08-11 | Discharge: 2020-08-12 | DRG: 607 | Disposition: A | Discharge status: Home / Self Care | Payer: No Typology Code available for payment source | Source: Other Acute Inpatient Hospital | Attending: Surgery | Admitting: Surgery

## 2020-08-11 DIAGNOSIS — Z9013 Acquired absence of bilateral breasts and nipples: Secondary | ICD-10-CM | POA: Diagnosis not present

## 2020-08-11 DIAGNOSIS — Z20822 Contact with and (suspected) exposure to covid-19: Secondary | ICD-10-CM | POA: Diagnosis present

## 2020-08-11 DIAGNOSIS — Z888 Allergy status to other drugs, medicaments and biological substances status: Secondary | ICD-10-CM

## 2020-08-11 DIAGNOSIS — Z8719 Personal history of other diseases of the digestive system: Secondary | ICD-10-CM | POA: Diagnosis not present

## 2020-08-11 DIAGNOSIS — E039 Hypothyroidism, unspecified: Secondary | ICD-10-CM | POA: Diagnosis present

## 2020-08-11 DIAGNOSIS — Z882 Allergy status to sulfonamides status: Secondary | ICD-10-CM

## 2020-08-11 DIAGNOSIS — Z79890 Hormone replacement therapy (postmenopausal): Secondary | ICD-10-CM | POA: Diagnosis not present

## 2020-08-11 DIAGNOSIS — Z8042 Family history of malignant neoplasm of prostate: Secondary | ICD-10-CM | POA: Diagnosis not present

## 2020-08-11 DIAGNOSIS — Z79899 Other long term (current) drug therapy: Secondary | ICD-10-CM | POA: Diagnosis not present

## 2020-08-11 DIAGNOSIS — M069 Rheumatoid arthritis, unspecified: Secondary | ICD-10-CM | POA: Diagnosis present

## 2020-08-11 DIAGNOSIS — F419 Anxiety disorder, unspecified: Secondary | ICD-10-CM | POA: Diagnosis present

## 2020-08-11 DIAGNOSIS — R21 Rash and other nonspecific skin eruption: Secondary | ICD-10-CM | POA: Diagnosis present

## 2020-08-11 DIAGNOSIS — M1990 Unspecified osteoarthritis, unspecified site: Secondary | ICD-10-CM | POA: Diagnosis present

## 2020-08-11 DIAGNOSIS — Z8249 Family history of ischemic heart disease and other diseases of the circulatory system: Secondary | ICD-10-CM | POA: Diagnosis not present

## 2020-08-11 DIAGNOSIS — Z825 Family history of asthma and other chronic lower respiratory diseases: Secondary | ICD-10-CM | POA: Diagnosis not present

## 2020-08-11 DIAGNOSIS — Z87891 Personal history of nicotine dependence: Secondary | ICD-10-CM | POA: Diagnosis not present

## 2020-08-11 DIAGNOSIS — Z803 Family history of malignant neoplasm of breast: Secondary | ICD-10-CM | POA: Diagnosis not present

## 2020-08-11 DIAGNOSIS — Z853 Personal history of malignant neoplasm of breast: Secondary | ICD-10-CM

## 2020-08-11 DIAGNOSIS — Z8261 Family history of arthritis: Secondary | ICD-10-CM

## 2020-08-11 DIAGNOSIS — Z811 Family history of alcohol abuse and dependence: Secondary | ICD-10-CM

## 2020-08-11 LAB — SARS CORONAVIRUS 2 (TAT 6-24 HRS): SARS Coronavirus 2: NEGATIVE

## 2020-08-11 MED ORDER — ONDANSETRON HCL 4 MG/2ML IJ SOLN: 4.0000 mg | Freq: Four times a day (QID) | INTRAMUSCULAR | Status: DC | PRN

## 2020-08-11 MED ORDER — METHYLPREDNISOLONE SODIUM SUCC 125 MG IJ SOLR
125.0000 mg | Freq: Three times a day (TID) | INTRAMUSCULAR | Status: DC
  Administered 2020-08-11 – 2020-08-12 (×3): 125 mg via INTRAVENOUS
  Filled 2020-08-11 (×3): qty 2

## 2020-08-11 MED ORDER — DIPHENHYDRAMINE HCL 50 MG/ML IJ SOLN: 25.0000 mg | Freq: Four times a day (QID) | INTRAMUSCULAR | Status: DC | PRN

## 2020-08-11 MED ORDER — METOPROLOL TARTRATE 5 MG/5ML IV SOLN: 5.0000 mg | Freq: Four times a day (QID) | INTRAVENOUS | Status: DC | PRN

## 2020-08-11 MED ORDER — ENOXAPARIN SODIUM 40 MG/0.4ML ~~LOC~~ SOLN
40.0000 mg | SUBCUTANEOUS | Status: DC
  Filled 2020-08-11: qty 0.4

## 2020-08-11 MED ORDER — POLYETHYLENE GLYCOL 3350 17 G PO PACK: 17.0000 g | PACK | Freq: Every day | ORAL | Status: DC | PRN

## 2020-08-11 MED ORDER — DIPHENHYDRAMINE HCL 25 MG PO CAPS
25.0000 mg | ORAL_CAPSULE | Freq: Four times a day (QID) | ORAL | Status: DC | PRN
  Administered 2020-08-11: 25 mg via ORAL
  Filled 2020-08-11: qty 1

## 2020-08-11 MED ORDER — HYDROXYZINE HCL 25 MG PO TABS
25.0000 mg | ORAL_TABLET | Freq: Three times a day (TID) | ORAL | Status: DC
  Administered 2020-08-11 – 2020-08-12 (×2): 25 mg via ORAL
  Filled 2020-08-11 (×2): qty 1

## 2020-08-11 MED ORDER — ONDANSETRON 4 MG PO TBDP: 4.0000 mg | ORAL_TABLET | Freq: Four times a day (QID) | ORAL | Status: DC | PRN

## 2020-08-11 MED ORDER — SODIUM CHLORIDE 0.9 % IV SOLN: INTRAVENOUS | Status: DC

## 2020-08-11 MED ORDER — ACETAMINOPHEN 325 MG PO TABS: 650.0000 mg | ORAL_TABLET | Freq: Four times a day (QID) | ORAL | Status: DC | PRN

## 2020-08-11 MED ORDER — DIPHENHYDRAMINE-ZINC ACETATE 2-0.1 % EX CREA
TOPICAL_CREAM | Freq: Three times a day (TID) | CUTANEOUS | Status: DC | PRN
  Filled 2020-08-11: qty 28

## 2020-08-11 MED ORDER — IBUPROFEN 600 MG PO TABS: 600.0000 mg | ORAL_TABLET | Freq: Four times a day (QID) | ORAL | Status: DC | PRN

## 2020-08-11 MED ORDER — ACETAMINOPHEN 650 MG RE SUPP: 650.0000 mg | Freq: Four times a day (QID) | RECTAL | Status: DC | PRN

## 2020-08-11 MED ORDER — OXYCODONE HCL 5 MG PO TABS
5.0000 mg | ORAL_TABLET | ORAL | Status: DC | PRN
  Filled 2020-08-11: qty 2

## 2020-08-11 MED ORDER — WHITE PETROLATUM EX OINT
TOPICAL_OINTMENT | CUTANEOUS | Status: AC
  Filled 2020-08-11: qty 28.35

## 2020-08-11 MED ORDER — MORPHINE SULFATE (PF) 2 MG/ML IV SOLN: 2.0000 mg | INTRAVENOUS | Status: DC | PRN

## 2020-08-11 MED ORDER — HYDROXYZINE HCL 25 MG PO TABS: 25.0000 mg | ORAL_TABLET | Freq: Three times a day (TID) | ORAL | Status: DC | PRN

## 2020-08-11 NOTE — H&P (Signed)
Admission Note  Alicia Mcconnell [birth year]  [id number].    Chief Complaint/Reason for Consult: rash and concern about mastectomy incisions HPI:  Patient is a 60 year old female who recently underwent bilateral simple mastectomy on 07/29/20 by Dr. Brantley Stage. Pathology confirmed DCIS. She presented to the ED in [place] today with complaints of rash over trunk and extremities, and concerns about mastectomy wounds. States that she used Hibiclens 3 days ago, but cannot think of any other medications, detergents, or diet changes that could have caused her hives. The rash appeared 2 days ago. It is very itchy and painful. Denies lesions in her mouth or on her palms or feet. She went to urgent care two days ago who thought she could have hand foot and mouth disease. She was given atarax which helped a little. She called our office and was prescribed prednisone 20mg ; she has taken 1 dose of this. She spiked a temp of 102 today. Denies erythema or drainage from surgical wounds. Minimal drainage from JP drains: <5cc serous fluid per day from the right drain, and about 25cc serous fluid from the left daily.  Transferred to [facility] for admission.  PMH otherwise significant for thyroid disease, anxiety, and rheumatoid arthritis. Allergies listed to methimazole and sulfa antibiotics. Patient is not on any blood thinning medications.   ROS: Review of Systems  Constitutional: Positive for fever.  HENT: Negative.   Eyes: Negative.   Respiratory: Negative.   Cardiovascular: Negative.   Gastrointestinal: Negative.   Genitourinary: Negative.   Musculoskeletal: Negative.   Skin: Positive for rash.  Neurological: Negative.     Family History  Problem Relation Age of Onset  . Arthritis Mother 55       RA  . Alcohol abuse Father   . Hypertension Father   . COPD Father        d. 72  . Alcohol abuse Sister   . Alcohol abuse Brother   . Breast cancer Paternal Aunt        x2 dx  . Heart attack  Maternal Grandfather   . Prostate cancer Paternal Grandfather        dx 37s  . Healthy Daughter     Past Medical History:  Diagnosis Date  . ALLERGIC RHINITIS 10/08/2009  . Arthritis    psoriatic arthritis-on Enbrel and Methotrexate  . Breast cancer ([place]) 06/2020   Right breast DCIS  . Cancer ([place]) 1996   infiltrating ductal, lumpectomy with axillary node dissection.  ER posivtive     . COLONIC POLYPS 10/08/2009  . Edema 10/08/2009  . Family history of breast cancer   . Family history of prostate cancer   . Graves disease   . HYPERTHYROIDISM 11/03/2009  . Hypothyroidism   . Microscopic hematuria 11/01/2009  . NEOPLASM, MALIGNANT, BREAST, HX OF 10/08/2009  . THYROID FUNCTION TEST, ABNORMAL 11/01/2009    Past Surgical History:  Procedure Laterality Date  . BREAST SURGERY     breast CA, 6 weeks radiation  . CHOLECYSTECTOMY    . labrum tear Right 2/12  . SIMPLE MASTECTOMY WITH AXILLARY SENTINEL NODE BIOPSY Bilateral 07/29/2020   Procedure: BILATERAL SIMPLE MASTECTOMY;  Surgeon: Erroll Luna, MD;  Location: [facility];  Service: General;  Laterality: Bilateral;  PECTORAL BLOCK, RNFA    Social History:  reports that she quit smoking about 6 weeks ago. Her smoking use included cigarettes. She has a 12.50 pack-year smoking history. She has never used smokeless tobacco. She reports  current alcohol use. She reports that she does not use drugs.  Allergies:  Allergies  Allergen Reactions  . Methimazole Hives    hives  . Sulfa Antibiotics     hives    Medications Prior to Admission  Medication Sig Dispense Refill  . cetirizine (ZYRTEC) 10 MG tablet Take 1 tablet (10 mg total) by mouth daily. For at least 2 weeks 30 tablet 1  . cyclobenzaprine (FLEXERIL) 5 MG tablet Take 1 tablet (5 mg total) by mouth 3 (three) times daily as needed for muscle spasms. 40 tablet 1  . etanercept (ENBREL SURECLICK) 50 MG/ML injection Inject 50 mg into the skin once a week.    .  fluticasone (FLONASE) 50 MCG/ACT nasal spray PLACE 1 SPRAY INTO BOTH NOSTRILS DAILY. 48 mL 1  . folic acid (FOLVITE) 1 MG tablet Take 1 mg by mouth daily.    Marland Kitchen gabapentin (NEURONTIN) 100 MG capsule TAKE 2 CAPSULES (200 MG TOTAL) BY MOUTH AT BEDTIME. 180 capsule 2  . ibuprofen (ADVIL) 800 MG tablet Take 1 tablet (800 mg total) by mouth every 8 (eight) hours as needed. 30 tablet 0  . levothyroxine (SYNTHROID, LEVOTHROID) 137 MCG tablet Take 125 mcg by mouth daily.    Marland Kitchen linaclotide (LINZESS) 290 MCG CAPS capsule Take by mouth.    . meloxicam (MOBIC) 15 MG tablet TAKE 1 TABLET BY MOUTH EVERY DAY 30 tablet 0  . methotrexate (RHEUMATREX) 2.5 MG tablet Take 2.5 mg by mouth once a week. PATIENT TAKES 8 2.5GM TABLETS WEEKLY    . oxyCODONE (OXY IR/ROXICODONE) 5 MG immediate release tablet Take 1 tablet (5 mg total) by mouth every 6 (six) hours as needed for severe pain. 15 tablet 0    Blood pressure 93/61, pulse 94, SpO2 98 %. Physical Exam:  General: WD, WN female who is laying in bed in NAD HEENT: head is normocephalic, atraumatic.  Sclera are noninjected.  PERRL.  Ears and nose without any masses or lesions.  Mouth is pink and moist Heart: regular, rate, and rhythm.  Normal s1,s2. No obvious murmurs, gallops, or rubs noted.  Palpable radial and pedal pulses bilaterally Lungs: CTAB, no wheezes, rhonchi, or rales noted.  Respiratory effort nonlabored Breast: s/p bilateral mastectomy, incisions with appear clean without any purulent drainage, drains with small amount of serous fluid in bulbs  Abd: soft, NT, ND, +BS, no masses, hernias, or organomegaly MS: all 4 extremities are symmetrical with no cyanosis, clubbing, or edema. Skin: maculopapular rash over back, chest, upper and lower extremities      No results found for this or any previous visit (from the past 48 hour(s)). No results found.    Assessment/Plan Breast Cancer (DCIS) S/p bilateral mastectomy 07/29/20 Dr. Brantley Stage - POD#13 -  wounds appear clean and not infected, I do not think she needs any further imaging - having about 5cc per day from the right, and 25cc per drain from the left drain. Fluid in bulbs appears mostly serous  Rash of unknown origin - Unsure as to the cause of her hives, but could be allergic reaction to Hibiclens she used 3 days ago, no other medication, detergent, or diet changes recently since rash appeared 2 days ago. Will schedule IV solu-medrol (125mg  q8 hours) until rash improves, atarax and topical benadryl PRN for itching.   FEN: reg diet, IVF @ 100cc/hr VTE: SCDs, lovenox ID: none  Margie Billet, [facility] 08/11/2020, 4:07 PM Please see Amion for pager number during day hours  7:00am-4:30pm

## 2020-08-12 DIAGNOSIS — R21 Rash and other nonspecific skin eruption: Secondary | ICD-10-CM | POA: Diagnosis present

## 2020-08-12 LAB — CBC
HCT: 33.4 % — ABNORMAL LOW (ref 36.0–46.0)
Hemoglobin: 11.7 g/dL — ABNORMAL LOW (ref 12.0–15.0)
MCH: 34.3 pg — ABNORMAL HIGH (ref 26.0–34.0)
MCHC: 35 g/dL (ref 30.0–36.0)
MCV: 97.9 fL (ref 80.0–100.0)
Platelets: 228 10*3/uL (ref 150–400)
RBC: 3.41 MIL/uL — ABNORMAL LOW (ref 3.87–5.11)
RDW: 12.8 % (ref 11.5–15.5)
WBC: 16.1 10*3/uL — ABNORMAL HIGH (ref 4.0–10.5)
nRBC: 0 % (ref 0.0–0.2)

## 2020-08-12 LAB — BASIC METABOLIC PANEL
Anion gap: 9 (ref 5–15)
BUN: 13 mg/dL (ref 6–20)
CO2: 21 mmol/L — ABNORMAL LOW (ref 22–32)
Calcium: 7.7 mg/dL — ABNORMAL LOW (ref 8.9–10.3)
Chloride: 108 mmol/L (ref 98–111)
Creatinine, Ser: 0.61 mg/dL (ref 0.44–1.00)
GFR, Estimated: >60 mL/min (ref >60)
Glucose, Bld: 180 mg/dL — ABNORMAL HIGH (ref 70–99)
Potassium: 3.8 mmol/L (ref 3.5–5.1)
Sodium: 138 mmol/L (ref 135–145)

## 2020-08-12 MED ORDER — DOXYCYCLINE HYCLATE 100 MG PO TABS
100.0000 mg | ORAL_TABLET | Freq: Two times a day (BID) | ORAL | 0 refills | Status: AC
Start: 2020-08-12 — End: 2020-08-19

## 2020-08-12 MED ORDER — DOXYCYCLINE HYCLATE 100 MG PO TABS
100.0000 mg | ORAL_TABLET | Freq: Two times a day (BID) | ORAL | Status: DC
  Administered 2020-08-12: 100 mg via ORAL
  Filled 2020-08-12: qty 1

## 2020-08-12 NOTE — Discharge Instructions (Addendum)
Take antibiotic (doxycycline) and prednisone as prescribed and to completion. Follow up with Dr. Brantley Stage as scheduled next week Document drain output (ie color and quantity of fluid) Call with any questions or concerns   Surgical [facility] Surgical drains are used to remove extra fluid that normally builds up in a surgical wound after surgery. A surgical drain helps to heal a surgical wound. Different kinds of surgical drains include:  Active drains. These drains use suction to pull drainage away from the surgical wound. Drainage flows through a tube to a container outside of the body. With these drains, you need to keep the bulb or the drainage container flat (compressed) at all times, except while you empty it. Flattening the bulb or container creates suction.  Passive drains. These drains allow fluid to drain naturally, by gravity. Drainage flows through a tube to a bandage (dressing) or a container outside of the body. Passive drains do not need to be emptied. A drain is placed during surgery. Right after surgery, drainage is usually bright red and a little thicker than water. The drainage may gradually turn yellow or pink and become thinner. It is likely that your health care provider will remove the drain when the drainage stops or when the amount decreases to 1-2 Tbsp (15-30 mL) during a 24-hour period. Supplies needed:  Tape.  Germ-free cleaning solution (sterile saline).  Cotton swabs.  Split gauze drain sponge: 4 x 4 inches (10 x 10 cm).  Gauze square: 4 x 4 inches (10 x 10 cm). How to care for your surgical drain Care for your drain as told by your health care provider. This is important to help prevent infection. If your drain is placed at your back, or any other hard-to-reach area, ask another person to assist you in performing the following tasks: General care  Keep the skin around the drain dry and covered with a dressing at all times.  Check your drain area every  day for signs of infection. Check for: ? Redness, swelling, or pain. ? Pus or a bad smell. ? Cloudy drainage. ? Tenderness or pressure at the drain exit site. Changing the dressing Follow instructions from your health care provider about how to change your dressing. Change your dressing at least once a day. Change it more often if needed to keep the dressing dry. Make sure you: 1. Gather your supplies. 2. Wash your hands with soap and water before you change your dressing. If soap and water are not available, use hand sanitizer. 3. Remove the old dressing. Avoid using scissors to do that. 4. Wash your hands with soap and water again after removing the old dressing. 5. Use sterile saline to clean your skin around the drain. You may need to use a cotton swab to clean the skin. 6. Place the tube through the slit in a drain sponge. Place the drain sponge so that it covers your wound. 7. Place the gauze square or another drain sponge on top of the drain sponge that is on the wound. Make sure the tube is between those layers. 8. Tape the dressing to your skin. 9. Tape the drainage tube to your skin 1-2 inches (2.5-5 cm) below the place where the tube enters your body. Taping keeps the tube from pulling on any stitches (sutures) that you have. 10. Wash your hands with soap and water. 11. Write down the color of your drainage and how often you change your dressing. How to empty your active drain  1. Make sure that you have a measuring cup that you can empty your drainage into. 2. Wash your hands with soap and water. If soap and water are not available, use hand sanitizer. 3. Loosen any pins or clips that hold the tube in place. 4. If your health care provider tells you to strip the tube to prevent clots and tube blockages: ? Hold the tube at the skin with one hand. Use your other hand to pinch the tubing with your thumb and first finger. ? Gently move your fingers down the tube while squeezing very  lightly. This clears any drainage, clots, or tissue from the tube. ? You may need to do this several times each day to keep the tube clear. Do not pull on the tube. 5. Open the bulb cap or the drain plug. Do not touch the inside of the cap or the bottom of the plug. 6. Turn the device upside down and gently squeeze. 7. Empty all of the drainage into the measuring cup. 8. Compress the bulb or the container and replace the cap or the plug. To compress the bulb or the container, squeeze it firmly in the middle while you close the cap or plug the container. 9. Write down the amount of drainage that you have in each 24-hour period. If you have less than 2 Tbsp (30 mL) of drainage during 24 hours, contact your health care provider. 10. Flush the drainage down the toilet. 11. Wash your hands with soap and water.   Contact a health care provider if:  You have redness, swelling, or pain around your drain area.  You have pus or a bad smell coming from your drain area.  You have a fever or chills.  The skin around your drain is warm to the touch.  The amount of drainage that you have is increasing instead of decreasing.  You have drainage that is cloudy.  There is a sudden stop or a sudden decrease in the amount of drainage that you have.  Your drain tube falls out.  Your active drain does not stay compressed after you empty it. Summary  Surgical drains are used to remove extra fluid that normally builds up in a surgical wound after surgery.  Different kinds of surgical drains include active drains and passive drains. Active drains use suction to pull drainage away from the surgical wound, and passive drains allow fluid to drain naturally.  It is important to care for your drain to prevent infection. If your drain is placed at your back, or any other hard-to-reach area, ask another person to assist you.  Contact your health care provider if you have redness, swelling, or pain around your  drain area. This information is not intended to replace advice given to you by your health care provider. Make sure you discuss any questions you have with your health care provider. Document Revised: 08/14/2018 Document Reviewed: 08/14/2018 Elsevier Patient Education  2021 [employer].

## 2020-08-12 NOTE — Discharge Summary (Signed)
[facility] Discharge Summary   [name] MRN: [id number] DOB/AGE: [birth year] 60 y.o.  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 Brantley Stage Rash of unknown origin  Discharge Diagnosis Same  Consultants None  Imaging: No results found.  Procedures None  Hospital Course:  Alicia Mcconnell is a 60yo female who recently underwent bilateral simple mastectomy on 07/29/20 by Dr. Brantley Stage. Pathology confirmed DCIS. She was admitted to [facility] 1/19 with rash of unknown origin. She was started on IV solu-medrol and doxycycline, and given atarax PRN itching. The following day her rash was significantly improved. Her surgical JP drains had previously been draining minimal serous fluid, but the right drain started draining tan/purulent appearing fluid. On 1/20 the patient was feeling much better, rash improving, afebrile and felt stable for discharge home. She will go home with prescription for doxycycline and complete prednisone prescription she had already picked up. Patient will follow up as below and knows to call with questions or concerns.    Physical Exam: General:  Alert, NAD, comfortable Pulm: rate and effort normal Abd:  Soft, ND, NT Breast: s/p bilateral mastectomy, incisions appear clean with small areas of necrosis and without any purulent drainage or fluctuance, left drain with trace serous fluid in bulb, right drain with tan fluid in bulb Skin: diffuse maculopapular rash over back, chest, upper and lower extremities significantly improved from yesterday  Allergies as of 08/12/2020      Reactions   Methimazole Hives, Itching, Rash   Sulfa Antibiotics Nausea And Vomiting, Hives   hives   Methimazole Hives   hives      Medication List    STOP taking these medications   ibuprofen 800 MG tablet Commonly known as: ADVIL     TAKE these medications   cetirizine  10 MG tablet Commonly known as: ZYRTEC Take 1 tablet (10 mg total) by mouth daily. For at least 2 weeks   cyclobenzaprine 5 MG tablet Commonly known as: FLEXERIL Take 1 tablet (5 mg total) by mouth 3 (three) times daily as needed for muscle spasms.   doxycycline 100 MG tablet Commonly known as: VIBRA-TABS Take 1 tablet (100 mg total) by mouth every 12 (twelve) hours for 7 days.   Enbrel SureClick 50 MG/ML injection Generic drug: etanercept Inject 50 mg into the skin once a week.   fluticasone 50 MCG/ACT nasal spray Commonly known as: FLONASE PLACE 1 SPRAY INTO BOTH NOSTRILS DAILY.   folic acid 1 MG tablet Commonly known as: FOLVITE Take 1 mg by mouth daily.   gabapentin 100 MG capsule Commonly known as: NEURONTIN TAKE 2 CAPSULES (200 MG TOTAL) BY MOUTH AT BEDTIME.   levothyroxine 137 MCG tablet Commonly known as: SYNTHROID Take 125 mcg by mouth daily.   linaclotide 290 MCG Caps capsule Commonly known as: LINZESS Take by mouth.   meloxicam 15 MG tablet Commonly known as: MOBIC TAKE 1 TABLET BY MOUTH EVERY DAY   methotrexate 2.5 MG tablet Commonly known as: RHEUMATREX Take 2.5 mg by mouth once a week. PATIENT TAKES 8 2.5GM TABLETS WEEKLY   oxyCODONE 5 MG immediate release tablet Commonly known as: Oxy IR/ROXICODONE Take 1 tablet (5 mg total) by mouth every 6 (six) hours as needed for severe pain.         Follow-up Information    Erroll Luna, MD. Go on 08/16/2020.   Specialty: General Surgery Why: Your appointment is 1/24 at 10:30am Please  arrive 15 minutes prior to your appointment to check in. Contact information: [address] [place] [address] [phone]               Signed: Wellington Hampshire, [facility] 08/12/2020, 9:32 AM Please see Amion for pager number during day hours 7:00am-4:30pm

## 2020-08-14 ENCOUNTER — Encounter (HOSPITAL_COMMUNITY): Payer: Self-pay | Admitting: *Deleted

## 2020-08-14 ENCOUNTER — Emergency Department (HOSPITAL_COMMUNITY)
Admission: EM | Admit: 2020-08-14 | Discharge: 2020-08-14 | Disposition: A | Discharge status: Home / Self Care | Payer: No Typology Code available for payment source | Attending: Emergency Medicine | Admitting: Emergency Medicine

## 2020-08-14 ENCOUNTER — Emergency Department (HOSPITAL_COMMUNITY): Payer: No Typology Code available for payment source

## 2020-08-14 ENCOUNTER — Other Ambulatory Visit: Payer: Self-pay

## 2020-08-14 DIAGNOSIS — R21 Rash and other nonspecific skin eruption: Secondary | ICD-10-CM

## 2020-08-14 DIAGNOSIS — Z853 Personal history of malignant neoplasm of breast: Secondary | ICD-10-CM | POA: Insufficient documentation

## 2020-08-14 DIAGNOSIS — Z79899 Other long term (current) drug therapy: Secondary | ICD-10-CM | POA: Insufficient documentation

## 2020-08-14 DIAGNOSIS — R0789 Other chest pain: Secondary | ICD-10-CM | POA: Diagnosis not present

## 2020-08-14 DIAGNOSIS — E039 Hypothyroidism, unspecified: Secondary | ICD-10-CM | POA: Insufficient documentation

## 2020-08-14 DIAGNOSIS — Z87891 Personal history of nicotine dependence: Secondary | ICD-10-CM | POA: Diagnosis not present

## 2020-08-14 LAB — CBC WITH DIFFERENTIAL/PLATELET
Abs Immature Granulocytes: 0.16 10*3/uL — ABNORMAL HIGH (ref 0.00–0.07)
Basophils Absolute: 0 10*3/uL (ref 0.0–0.1)
Basophils Relative: 0 %
Eosinophils Absolute: 0 10*3/uL (ref 0.0–0.5)
Eosinophils Relative: 0 %
HCT: 34.4 % — ABNORMAL LOW (ref 36.0–46.0)
Hemoglobin: 11.1 g/dL — ABNORMAL LOW (ref 12.0–15.0)
Immature Granulocytes: 1 %
Lymphocytes Relative: 16 %
Lymphs Abs: 2.2 10*3/uL (ref 0.7–4.0)
MCH: 33 pg (ref 26.0–34.0)
MCHC: 32.3 g/dL (ref 30.0–36.0)
MCV: 102.4 fL — ABNORMAL HIGH (ref 80.0–100.0)
Monocytes Absolute: 0.8 10*3/uL (ref 0.1–1.0)
Monocytes Relative: 6 %
Neutro Abs: 10.9 10*3/uL — ABNORMAL HIGH (ref 1.7–7.7)
Neutrophils Relative %: 77 %
Platelets: 280 10*3/uL (ref 150–400)
RBC: 3.36 MIL/uL — ABNORMAL LOW (ref 3.87–5.11)
RDW: 13 % (ref 11.5–15.5)
WBC: 14.1 10*3/uL — ABNORMAL HIGH (ref 4.0–10.5)
nRBC: 0 % (ref 0.0–0.2)

## 2020-08-14 LAB — COMPREHENSIVE METABOLIC PANEL
ALT: 36 U/L (ref 0–44)
AST: 22 U/L (ref 15–41)
Albumin: 2.7 g/dL — ABNORMAL LOW (ref 3.5–5.0)
Alkaline Phosphatase: 44 U/L (ref 38–126)
Anion gap: 10 (ref 5–15)
BUN: 21 mg/dL — ABNORMAL HIGH (ref 6–20)
CO2: 21 mmol/L — ABNORMAL LOW (ref 22–32)
Calcium: 8.4 mg/dL — ABNORMAL LOW (ref 8.9–10.3)
Chloride: 108 mmol/L (ref 98–111)
Creatinine, Ser: 0.67 mg/dL (ref 0.44–1.00)
GFR, Estimated: >60 mL/min (ref >60)
Glucose, Bld: 132 mg/dL — ABNORMAL HIGH (ref 70–99)
Potassium: 3.6 mmol/L (ref 3.5–5.1)
Sodium: 139 mmol/L (ref 135–145)
Total Bilirubin: 0.5 mg/dL (ref 0.3–1.2)
Total Protein: 5.5 g/dL — ABNORMAL LOW (ref 6.5–8.1)

## 2020-08-14 LAB — TROPONIN I (HIGH SENSITIVITY)
Troponin I (High Sensitivity): 7 ng/L (ref <18)
Troponin I (High Sensitivity): 10 ng/L (ref <18)

## 2020-08-14 MED ORDER — METHYLPREDNISOLONE SODIUM SUCC 125 MG IJ SOLR
125.0000 mg | Freq: Once | INTRAMUSCULAR | Status: AC
  Administered 2020-08-14: 125 mg via INTRAVENOUS
  Filled 2020-08-14: qty 2

## 2020-08-14 MED ORDER — IOHEXOL 350 MG/ML SOLN
80.0000 mL | Freq: Once | INTRAVENOUS | Status: AC | PRN
  Administered 2020-08-14: 80 mL via INTRAVENOUS

## 2020-08-14 MED ORDER — FAMOTIDINE IN NACL 20-0.9 MG/50ML-% IV SOLN
20.0000 mg | Freq: Once | INTRAVENOUS | Status: AC
  Administered 2020-08-14: 20 mg via INTRAVENOUS
  Filled 2020-08-14: qty 50

## 2020-08-14 MED ORDER — DIPHENHYDRAMINE HCL 50 MG/ML IJ SOLN
50.0000 mg | Freq: Once | INTRAMUSCULAR | Status: AC
  Administered 2020-08-14: 50 mg via INTRAVENOUS
  Filled 2020-08-14: qty 1

## 2020-08-14 NOTE — ED Provider Notes (Signed)
[facility] EMERGENCY DEPARTMENT Provider Note   CSN: [id number] Arrival date & time: 08/14/20  0756     History Chief Complaint  Patient presents with  . Allergic Reaction    Alicia Mcconnell is a 60 y.o. female.  76 y,o female with a female with a PMH of Breast CA s/p BL mastectomy by Dr. Brantley Stage on  08/02/2020 presents to the ED with a chief complaint of persistent rash x 10 days. Patient was evaluated previously at [facility] urgent care.  She was then transferred and admitted into the hospital due to a fever of 102.  During her hospital course patient received 125 mg Solu-Medrol, Q8.  She was discharged from the hospital 2 days ago.  Reports hives improved while in the hospital.  States yesterday, hives returned along with some left periorbital swelling, she reports progressively getting worse.  Rash has now spread from extremities and trunk onto her face and head.  She has tried taking Benadryl, Atarax, Augmentin, doxycycline currently on day 2.  She has also taken additional gabapentin, 1 oxycodone, currently on 40 mg of steroids.  States she felt that the rash was not improving.  Called Dr. Donne Hazel who is on-call, and commended to be seen in the ED.  In addition, she reports chest pain, describes this as "my breastbone hurts ", states she feels more hoarse, however she currently takes gabapentin which "makes me dry ".  Also feels like she is having a harder time taking a deep breath, and feeling "swollen all over". Patient does have auto immune disorder and reports she has stop taking her methotrexate and embreio. No urinary symptoms, no cough or URI symptoms, no prior hx of cardiac disease or fever.    The history is provided by the patient and medical records. No language interpreter was used.       Past Medical History:  Diagnosis Date  . ALLERGIC RHINITIS 10/08/2009  . Arthritis    psoriatic arthritis-on Enbrel and Methotrexate  . Breast cancer ([place])  06/2020   Right breast DCIS  . Cancer ([place]) 1996   infiltrating ductal, lumpectomy with axillary node dissection.  ER posivtive     . COLONIC POLYPS 10/08/2009  . Edema 10/08/2009  . Family history of breast cancer   . Family history of prostate cancer   . Graves disease   . HYPERTHYROIDISM 11/03/2009  . Hypothyroidism   . Microscopic hematuria 11/01/2009  . NEOPLASM, MALIGNANT, BREAST, HX OF 10/08/2009  . THYROID FUNCTION TEST, ABNORMAL 11/01/2009    Patient Active Problem List   Diagnosis Date Noted  . Rash 08/12/2020  . Genetic testing 07/26/2020  . Ductal carcinoma in situ (DCIS) of right breast 07/05/2020  . Family history of breast cancer   . Family history of prostate cancer   . Lumbar paraspinal muscle spasm 10/16/2018  . Chronic insomnia 05/31/2015  . Hypothyroidism following radioiodine therapy 05/15/2011  . HYPERTHYROIDISM 11/03/2009  . MICROSCOPIC HEMATURIA 11/01/2009  . THYROID FUNCTION TEST, ABNORMAL 11/01/2009  . COLONIC POLYPS 10/08/2009  . EDEMA 10/08/2009  . NEOPLASM, MALIGNANT, BREAST, HX OF 10/08/2009    Past Surgical History:  Procedure Laterality Date  . BREAST SURGERY     breast CA, 6 weeks radiation  . CHOLECYSTECTOMY    . labrum tear Right 2/12  . SIMPLE MASTECTOMY WITH AXILLARY SENTINEL NODE BIOPSY Bilateral 07/29/2020   Procedure: BILATERAL SIMPLE MASTECTOMY;  Surgeon: Erroll Luna, MD;  Location: [place];  Service: General;  Laterality:  Bilateral;  PECTORAL BLOCK, RNFA     OB History   No obstetric history on file.     Family History  Problem Relation Age of Onset  . Arthritis Mother 70       RA  . Alcohol abuse Father   . Hypertension Father   . COPD Father        d. 28  . Alcohol abuse Sister   . Alcohol abuse Brother   . Breast cancer Paternal Aunt        x2 dx  . Heart attack Maternal Grandfather   . Prostate cancer Paternal Grandfather        dx 72s  . Healthy Daughter     Social History   Tobacco Use   . Smoking status: Former Smoker    Packs/day: 0.50    Years: 25.00    Pack years: 12.50    Types: Cigarettes    Quit date: 06/29/2020    Years since quitting: 0.1  . Smokeless tobacco: Never Used  Substance Use Topics  . Alcohol use: Yes    Alcohol/week: 0.0 standard drinks    Comment: rare  . Drug use: No    Home Medications Prior to Admission medications   Medication Sig Start Date End Date Taking? Authorizing Provider  cyclobenzaprine (FLEXERIL) 5 MG tablet Take 1 tablet (5 mg total) by mouth 3 (three) times daily as needed for muscle spasms. 10/15/18  Yes Burchette, Alinda Sierras, MD  doxycycline (VIBRA-TABS) 100 MG tablet Take 1 tablet (100 mg total) by mouth every 12 (twelve) hours for 7 days. 08/12/20 08/19/20 Yes Meuth, Brooke A, PA-C  etanercept (ENBREL SURECLICK) 50 MG/ML injection Inject 50 mg into the skin once a week.   Yes [provider]  fluticasone (FLONASE) 50 MCG/ACT nasal spray PLACE 1 SPRAY INTO BOTH NOSTRILS DAILY. Patient taking differently: Place 1 spray into both nostrils as needed for allergies or rhinitis. 10/27/19  Yes Burchette, Alinda Sierras, MD  folic acid (FOLVITE) 1 MG tablet Take 1 mg by mouth daily.   Yes [provider]  gabapentin (NEURONTIN) 100 MG capsule TAKE 2 CAPSULES (200 MG TOTAL) BY MOUTH AT BEDTIME. Patient taking differently: Take 200 mg by mouth at bedtime as needed (pain). 11/07/18  Yes Lyndal Pulley, DO  hydrOXYzine (ATARAX/VISTARIL) 25 MG tablet Take 25 mg by mouth 3 (three) times daily. 08/09/20  Yes [provider]  levothyroxine (SYNTHROID) 125 MCG tablet Take 125 mcg by mouth daily.   Yes [provider]  linaclotide (LINZESS) 290 MCG CAPS capsule Take 290 mcg by mouth as needed (chronic constipation).   Yes [provider]  meloxicam (MOBIC) 15 MG tablet TAKE 1 TABLET BY MOUTH EVERY DAY Patient taking differently: Take 15 mg by mouth daily. 04/23/18  Yes Burchette, Alinda Sierras, MD  methotrexate  (RHEUMATREX) 2.5 MG tablet Take 20 mg by mouth once a week. PATIENT TAKES (8) 2.5GM TABLETS WEEKLY   Yes [provider]  predniSONE (DELTASONE) 20 MG tablet Take 40 mg by mouth daily. 08/10/20 08/20/20 Yes [provider]  cetirizine (ZYRTEC) 10 MG tablet Take 1 tablet (10 mg total) by mouth daily. For at least 2 weeks Patient not taking: No sig reported 01/21/17   Noe Gens, PA-C  oxyCODONE (OXY IR/ROXICODONE) 5 MG immediate release tablet Take 1 tablet (5 mg total) by mouth every 6 (six) hours as needed for severe pain. Patient not taking: No sig reported 07/29/20   Erroll Luna, MD  Allergies    Methimazole, Sulfa antibiotics, and Methimazole  Review of Systems   Review of Systems  Constitutional: Negative for fever.  HENT: Negative for sore throat.   Respiratory: Positive for shortness of breath.   Cardiovascular: Positive for chest pain.  Gastrointestinal: Negative for abdominal pain, nausea and vomiting.  Genitourinary: Negative for flank pain.  Musculoskeletal: Negative for back pain.  Skin: Positive for color change and rash.  Neurological: Positive for weakness. Negative for light-headedness.  All other systems reviewed and are negative.   Physical Exam Updated Vital Signs BP (!) 143/85   Pulse (!) 50   Temp 98.2 F (36.8 C) (Oral)   Resp 16   Ht 5\' 7"  (1.702 m)   Wt 86.2 kg   SpO2 100%   BMI 29.76 kg/m   Physical Exam Vitals and nursing note reviewed.  Constitutional:      Appearance: Normal appearance.  HENT:     Head: Normocephalic.     Mouth/Throat:     Mouth: Mucous membranes are moist.  Cardiovascular:     Rate and Rhythm: Normal rate.     Comments: No BL pitting edema, no calf tenderness.  Pulmonary:     Effort: Pulmonary effort is normal.     Breath sounds: No wheezing or rales.     Comments: Lungs are clear to auscultation.  Abdominal:     General: Abdomen is flat.     Tenderness: There is no abdominal tenderness. There  is no right CVA tenderness or left CVA tenderness.  Musculoskeletal:     Cervical back: Normal range of motion and neck supple.     Right lower leg: No edema.     Left lower leg: No edema.  Skin:    Findings: Rash present.     Comments: Please see photos attached.   Neurological:     Mental Status: She is alert and oriented to person, place, and time.           ED Results / Procedures / Treatments   Labs (all labs ordered are listed, but only abnormal results are displayed) Labs Reviewed  CBC WITH DIFFERENTIAL/PLATELET - Abnormal; Notable for the following components:      Result Value   WBC 14.1 (*)    RBC 3.36 (*)    Hemoglobin 11.1 (*)    HCT 34.4 (*)    MCV 102.4 (*)    Neutro Abs 10.9 (*)    Abs Immature Granulocytes 0.16 (*)    All other components within normal limits  COMPREHENSIVE METABOLIC PANEL - Abnormal; Notable for the following components:   CO2 21 (*)    Glucose, Bld 132 (*)    BUN 21 (*)    Calcium 8.4 (*)    Total Protein 5.5 (*)    Albumin 2.7 (*)    All other components within normal limits  TROPONIN I (HIGH SENSITIVITY)  TROPONIN I (HIGH SENSITIVITY)    EKG None  Radiology DG Chest 2 View  Result Date: 08/14/2020 CLINICAL DATA:  Chest pain and shortness of breath. EXAM: CHEST - 2 VIEW COMPARISON:  Chest radiograph 01/21/2017. FINDINGS: Monitoring leads overlie the patient. Stable cardiac and mediastinal contours. Bilateral anterior chest wall drains from mastectomies demonstrated. Right axillary surgical clips. No large area pulmonary consolidation. No pleural effusion or pneumothorax. Cholecystectomy clips. IMPRESSION: No acute cardiopulmonary process. Electronically Signed   By: Lovey Newcomer M.D.   On: 08/14/2020 10:02   CT Angio Chest PE W and/or Wo Contrast  Result Date: 08/14/2020 CLINICAL DATA:  Chest pain and shortness of breath. Breast cancer recurrence this year status post double mastectomy. EXAM: CT ANGIOGRAPHY CHEST WITH CONTRAST  TECHNIQUE: Multidetector CT imaging of the chest was performed using the standard protocol during bolus administration of intravenous contrast. Multiplanar CT image reconstructions and MIPs were obtained to evaluate the vascular anatomy. CONTRAST:  34mL OMNIPAQUE IOHEXOL 350 MG/ML SOLN COMPARISON:  Chest x-ray January 22nd 2022 FINDINGS: Cardiovascular: Satisfactory opacification of the pulmonary arteries to the segmental level. No evidence of pulmonary embolism. Normal heart size. No pericardial effusion. Mediastinum/Nodes: No enlarged mediastinal, hilar, or axillary lymph nodes. Thyroid gland, trachea, and esophagus demonstrate no significant findings. Lungs/Pleura: Lungs are clear. No pleural effusion or pneumothorax. Upper Abdomen: No acute abnormality. Musculoskeletal: Degenerative joint changes of the spine are noted. No definite focal bony lesions are noted. Subcutaneous air is identified over the anterior right chest question postoperative change. Clinical correlation regarding date of mastectomy is suggested. Review of the MIP images confirms the above findings. IMPRESSION: 1. No pulmonary embolus. 2. Subcutaneous air is identified over the anterior right chest question postoperative change. Clinical correlation regarding date of mastectomy is suggested. Electronically Signed   By: Abelardo Diesel M.D.   On: 08/14/2020 13:35    Procedures Procedures (including critical care time)  Medications Ordered in ED Medications  methylPREDNISolone sodium succinate (SOLU-MEDROL) 125 mg/2 mL injection 125 mg (125 mg Intravenous Given 08/14/20 1043)  diphenhydrAMINE (BENADRYL) injection 50 mg (50 mg Intravenous Given 08/14/20 1147)  famotidine (PEPCID) IVPB 20 mg premix (20 mg Intravenous New Bag/Given 08/14/20 1147)  iohexol (OMNIPAQUE) 350 MG/ML injection 80 mL (80 mLs Intravenous Contrast Given 08/14/20 1311)    ED Course  I have reviewed the triage vital signs and the nursing notes.  Pertinent labs &  imaging results that were available during my care of the patient were reviewed by me and considered in my medical decision making (see chart for details).    MDM Rules/Calculators/A&P     Patient with a recent diagnosis of ductal carcinoma in situ status postmastectomy by Dr. Bary Castilla on August 02, 2020 presents to the ED today with worsening rash.  Patient was seen originally at [facility] urgent care, after spiking a fever of 102, she then was admitted to [facility], treated by surgery for a rash along with given doxycycline outpatient therapy to cover her for infection as she was having decreased output to her JP drain on the right.  Reports going home after 1 day of IV Solu-Medrol every 8, states hives have now returned to her extremities, trunk, periorbital region.  She also reports feeling more hoarse, some shortness of breath, some chest pain.  She has been taking medications including Benadryl, Atarax, prednisone 40 daily, doxycycline 100 mg twice daily, Gabapentin which she attributes her hoarseness 2. Oxycodone for pain control without improvement in her symptoms.  She did speak to Dr. Donne Hazel, on-call provider for [place] general surgery who recommended that she should also take an additional Augmentin while at home along with be evaluated in the emergency department.  Patient reports no fever, although she has been taking ibuprofen along with Tylenol around-the-clock, therefore she states "not sure if this is masking the fever ".  She denies any abdominal pain, decrease in urination, URI symptoms.   9:53 AM Spoke to Dr. Kae Heller from [facility] general surgery who recommended patient continue treatment at home with provided home meds.  She is to call the office to make an  appointment and be receiving upcoming week.  He is to return to the ED if rash worsens.  CBC with slight leukocytosis, however this is within her base from prior visit 2 days ago, suspect likely due to persistent  steroid use.  X-ray of her chest without any effusion, or consolidation.  EKG is normal sinus rhythm, without any changes consistent with an fraction.  CMP without any electrolyte derangement, creatinine level is within normal limits.  LFTs are unremarkable.  First troponin was negative, will obtain delta, though I have a lower suspicion for ACS at this time as she reports chest pain has felt the same during her prior postop days.  She does endorse having a difficulty taking a deep breath, we discussed CT Angio versus dimer, patient is agreeable of CT Angio at this time.  She was also given Pepcid, Benadryl to help with rash.   CT Angio showed:  1. No pulmonary embolus.  2. Subcutaneous air is identified over the anterior right chest  question postoperative change. Clinical correlation regarding date  of mastectomy is suggested.     These results were discussed at length with patient, she was provided with a copy of her CT scan to keep for her records.  She is aware she will need to continue with her steroids at home, along with additional medications such as Atarax and Benadryl.  Patient is agreeable at this time of plan and management.  Vitals have remained stable during the ED, patient stable for discharge.  Portions of this note were generated with [employer]. Dictation errors may occur despite best attempts at proofreading.  Final Clinical Impression(s) / ED Diagnoses Final diagnoses:  Rash  Chest wall pain    Rx / DC Orders ED Discharge Orders    None       Janeece Fitting, Hershal Coria 08/14/20 1421    Luna Fuse, MD 08/14/20 1444

## 2020-08-14 NOTE — ED Notes (Signed)
Patient verbalizes understanding of discharge instructions. Opportunity for questioning and answers were provided. Armband removed by staff, pt discharged from ED.  

## 2020-08-14 NOTE — ED Triage Notes (Signed)
Patient presents to ED c/o allergic reaction staets she was d/c'd from hospital 2 days ago for same. States last pm started with hives on left shoulder today eye are swollen ,states she feels hoarse and c/o sob.Marland Kitchen Recent hx. Bilateral mastectomy JP drains still in.

## 2020-08-14 NOTE — Discharge Instructions (Signed)
We discussed the results of your CT scan on today's visit.  You may continue taking your medication that was prescribed by Dr. Ma Rings, this includes the prednisone, antibiotics, Benadryl.  If you experience any shortness of breath, chest pain, worsening symptoms please return to the emergency room.

## 2020-08-18 ENCOUNTER — Telehealth: Payer: Self-pay | Admitting: Hematology and Oncology

## 2020-08-18 NOTE — Telephone Encounter (Signed)
Received a new pt referral from Dr. Brantley Stage for DCIS. Alicia Mcconnell has been cld and scheduled to see Dr. Lindi Adie on 2/8 at 4pm. Pt aware to arrive 15 minutes early.

## 2020-08-25 ENCOUNTER — Telehealth: Payer: Self-pay | Admitting: Hematology and Oncology

## 2020-08-25 NOTE — Telephone Encounter (Signed)
Ms. Ferrington cld to rechedule her appt w/Dr. Lindi Adie to 2/14 at 345pm.

## 2020-08-31 ENCOUNTER — Telehealth: Payer: Self-pay | Admitting: Hematology and Oncology

## 2020-08-31 ENCOUNTER — Inpatient Hospital Stay: Payer: No Typology Code available for payment source | Admitting: Hematology and Oncology

## 2020-08-31 NOTE — Telephone Encounter (Signed)
Alicia Mcconnell has been cld and rescheduled to see Dr. Lindi Adie on 2/17 at 345pm.

## 2020-09-06 ENCOUNTER — Inpatient Hospital Stay: Payer: No Typology Code available for payment source | Admitting: Hematology and Oncology

## 2020-09-08 NOTE — Progress Notes (Signed)
[facility] NOTE  Patient Care Team: Eulas Post, MD as PCP - General  CHIEF COMPLAINTS/PURPOSE OF CONSULTATION:  Newly diagnosed breast cancer  HISTORY OF PRESENTING ILLNESS:  Alicia Mcconnell 60 y.o. female is here because of recent diagnosis of ductal carcinoma in situ of the right breast. Screening mammogram on 05/29/20 showed right breast calcifications. Diagnostic mammogram and [place] on 06/04/20 showed a 2.0cm group of calcifications in the upper outer right breast. Biopsy on 06/22/20 showed intermediate grade DCIS, ER= 100%, PR+ 80%. Genetic testing was negative. She underwent bilateral mastectomies on 07/29/20 with Dr. Brantley Stage for which pathology showed in the left breast, no evidence of malignancy, and in the right breast, intermediate grade DCIS, 1.2cm, clear margins. She was admitted to [facility] from 08/11/20-08/12/20 for a persistent rash. She has a history of right breast cancer in 1996 treated with lumpectomy and radiation. She presents to the clinic today for initial evaluation and discussion of treatment options.   I reviewed her records extensively and collaborated the history with the patient.  SUMMARY OF ONCOLOGIC HISTORY: Oncology History  Ductal carcinoma in situ (DCIS) of right breast  1996 Initial Diagnosis   History of right breast cancer in 1996 treated with lumpectomy and radiation.   07/05/2020 Initial Diagnosis   Screening mammogram detected right breast calcifications. Diagnostic mammogram showed a 2.0cm group of calcifications in the upper outer right breast. Biopsy showed intermediate grade DCIS, ER+ 100%, PR+ 80%.    07/22/2020 Genetic Testing   Negative genetic testing on the CancerNext-Expanded+RNAinsight.  The CancerNext-Expanded gene panel offered by [facility] and includes sequencing and rearrangement analysis for the following 77 genes: AIP, ALK, APC*, ATM*, AXIN2, BAP1, BARD1, BLM, BMPR1A, BRCA1*, BRCA2*, BRIP1*, CDC73,  CDH1*, CDK4, CDKN1B, CDKN2A, CHEK2*, CTNNA1, DICER1, FANCC, FH, FLCN, GALNT12, KIF1B, LZTR1, MAX, MEN1, MET, MLH1*, MSH2*, MSH3, MSH6*, MUTYH*, NBN, NF1*, NF2, NTHL1, PALB2*, PHOX2B, PMS2*, POT1, PRKAR1A, PTCH1, PTEN*, RAD51C*, RAD51D*, RB1, RECQL, RET, SDHA, SDHAF2, SDHB, SDHC, SDHD, SMAD4, SMARCA4, SMARCB1, SMARCE1, STK11, SUFU, TMEM127, TP53*, TSC1, TSC2, VHL and XRCC2 (sequencing and deletion/duplication); EGFR, EGLN1, HOXB13, KIT, MITF, PDGFRA, POLD1, and POLE (sequencing only); EPCAM and GREM1 (deletion/duplication only). DNA and RNA analyses performed for * genes. The report date was July 22, 2020.   07/29/2020 Surgery   Bilateral mastectomies ([facility]):  Left breast: no evidence of malignancy  Right breast: intermediate grade DCIS, 1.2cm, clear margins.     MEDICAL HISTORY:  Past Medical History:  Diagnosis Date  . ALLERGIC RHINITIS 10/08/2009  . Arthritis    psoriatic arthritis-on Enbrel and Methotrexate  . Breast cancer ([place]) 06/2020   Right breast DCIS  . Cancer ([place]) 1996   infiltrating ductal, lumpectomy with axillary node dissection.  ER posivtive     . COLONIC POLYPS 10/08/2009  . Edema 10/08/2009  . Family history of breast cancer   . Family history of prostate cancer   . Graves disease   . HYPERTHYROIDISM 11/03/2009  . Hypothyroidism   . Microscopic hematuria 11/01/2009  . NEOPLASM, MALIGNANT, BREAST, HX OF 10/08/2009  . THYROID FUNCTION TEST, ABNORMAL 11/01/2009    SURGICAL HISTORY: Past Surgical History:  Procedure Laterality Date  . BREAST SURGERY     breast CA, 6 weeks radiation  . CHOLECYSTECTOMY    . labrum tear Right 2/12  . SIMPLE MASTECTOMY WITH AXILLARY SENTINEL NODE BIOPSY Bilateral 07/29/2020   Procedure: BILATERAL SIMPLE MASTECTOMY;  Surgeon: Erroll Luna, MD;  Location: [facility];  Service: General;  Laterality: Bilateral;  PECTORAL BLOCK, RNFA    SOCIAL HISTORY: Social History   Socioeconomic History  . Marital status: Divorced     Spouse name: Not on file  . Number of children: Not on file  . Years of education: Not on file  . Highest education level: Not on file  Occupational History  . Not on file  Tobacco Use  . Smoking status: Former Smoker    Packs/day: 0.50    Years: 25.00    Pack years: 12.50    Types: Cigarettes    Quit date: 06/29/2020    Years since quitting: 0.1  . Smokeless tobacco: Never Used  Substance and Sexual Activity  . Alcohol use: Yes    Alcohol/week: 0.0 standard drinks    Comment: rare  . Drug use: No  . Sexual activity: Not Currently    Birth control/protection: Post-menopausal  Other Topics Concern  . Not on file  Social History Narrative  . Not on file   Social Determinants of Health   Financial Resource Strain: Not on file  Food Insecurity: Not on file  Transportation Needs: Not on file  Physical Activity: Not on file  Stress: Not on file  Social Connections: Not on file  Intimate Partner Violence: Not on file    FAMILY HISTORY: Family History  Problem Relation Age of Onset  . Arthritis Mother 35       RA  . Alcohol abuse Father   . Hypertension Father   . COPD Father        d. 78  . Alcohol abuse Sister   . Alcohol abuse Brother   . Breast cancer Paternal Aunt        x2 dx  . Heart attack Maternal Grandfather   . Prostate cancer Paternal Grandfather        dx 70s  . Healthy Daughter     ALLERGIES:  is allergic to methimazole, sulfa antibiotics, and methimazole.  MEDICATIONS:  Current Outpatient Medications  Medication Sig Dispense Refill  . cetirizine (ZYRTEC) 10 MG tablet Take 1 tablet (10 mg total) by mouth daily. For at least 2 weeks (Patient not taking: No sig reported) 30 tablet 1  . cyclobenzaprine (FLEXERIL) 5 MG tablet Take 1 tablet (5 mg total) by mouth 3 (three) times daily as needed for muscle spasms. 40 tablet 1  . etanercept (ENBREL SURECLICK) 50 MG/ML injection Inject 50 mg into the skin once a week.    . fluticasone (FLONASE) 50  MCG/ACT nasal spray PLACE 1 SPRAY INTO BOTH NOSTRILS DAILY. (Patient taking differently: Place 1 spray into both nostrils as needed for allergies or rhinitis.) 48 mL 1  . folic acid (FOLVITE) 1 MG tablet Take 1 mg by mouth daily.    . gabapentin (NEURONTIN) 100 MG capsule TAKE 2 CAPSULES (200 MG TOTAL) BY MOUTH AT BEDTIME. (Patient taking differently: Take 200 mg by mouth at bedtime as needed (pain).) 180 capsule 2  . hydrOXYzine (ATARAX/VISTARIL) 25 MG tablet Take 25 mg by mouth 3 (three) times daily.    . levothyroxine (SYNTHROID) 125 MCG tablet Take 125 mcg by mouth daily.    . linaclotide (LINZESS) 290 MCG CAPS capsule Take 290 mcg by mouth as needed (chronic constipation).    . meloxicam (MOBIC) 15 MG tablet TAKE 1 TABLET BY MOUTH EVERY DAY (Patient taking differently: Take 15 mg by mouth daily.) 30 tablet 0  . methotrexate (RHEUMATREX) 2.5 MG tablet Take 20 mg by mouth once a week. PATIENT TAKES (  8) 2.5GM TABLETS WEEKLY    . oxyCODONE (OXY IR/ROXICODONE) 5 MG immediate release tablet Take 1 tablet (5 mg total) by mouth every 6 (six) hours as needed for severe pain. (Patient not taking: No sig reported) 15 tablet 0   No current facility-administered medications for this visit.    REVIEW OF SYSTEMS:     All other systems were reviewed with the patient and are negative.  PHYSICAL EXAMINATION: ECOG PERFORMANCE STATUS: 1 - Symptomatic but completely ambulatory  Vitals:   09/09/20 1601  BP: 130/70  Pulse: 70  Resp: 20  Temp: 97.9 F (36.6 C)  SpO2: 100%   There were no vitals filed for this visit.     LABORATORY DATA:  I have reviewed the data as listed Lab Results  Component Value Date   WBC 14.1 (H) 08/14/2020   HGB 11.1 (L) 08/14/2020   HCT 34.4 (L) 08/14/2020   MCV 102.4 (H) 08/14/2020   PLT 280 08/14/2020   Lab Results  Component Value Date   NA 139 08/14/2020   K 3.6 08/14/2020   CL 108 08/14/2020   CO2 21 (L) 08/14/2020    RADIOGRAPHIC STUDIES: I have  personally reviewed the radiological reports and agreed with the findings in the report.  ASSESSMENT AND PLAN:  Ductal carcinoma in situ (DCIS) of right breast 07/29/20: Bilateral mastectomies ([facility]):  Left breast: no evidence of malignancy  Right breast: intermediate grade DCIS, 1.2cm, clear margins.  Genetics: Negative  Pathology review: I discussed with the patient the difference between DCIS and invasive breast cancer. It is considered a precancerous lesion. DCIS is classified as a 0. It is generally detected through mammograms as calcifications. We discussed the significance of grades and its impact on prognosis. We also discussed the importance of ER and PR receptors and their implications to adjuvant treatment options. Prognosis of DCIS dependence on grade, comedo necrosis. It is anticipated that if not treated, 20-30% of DCIS can develop into invasive breast cancer.  Recommendation: Because she had bilateral mastectomies, breast cancer risk has been minimized and therefore there is no role of adjuvant treatments like radiation or antiestrogen therapy.   Extensive recent infections: She is currently on levofloxacin antibiotic and appears to be doing much better. Patient will be seen on an as-needed basis.    All questions were answered. The patient knows to call the clinic with any problems, questions or concerns.   Rulon Eisenmenger, MD, MPH 09/09/2020    I, Molly Dorshimer, am acting as scribe for Nicholas Lose, MD.  I have reviewed the above documentation for accuracy and completeness, and I agree with the above.

## 2020-09-09 ENCOUNTER — Other Ambulatory Visit: Payer: Self-pay

## 2020-09-09 ENCOUNTER — Inpatient Hospital Stay: Payer: No Typology Code available for payment source | Attending: Genetic Counselor | Admitting: Hematology and Oncology

## 2020-09-09 DIAGNOSIS — D0511 Intraductal carcinoma in situ of right breast: Secondary | ICD-10-CM | POA: Insufficient documentation

## 2020-09-09 DIAGNOSIS — L4050 Arthropathic psoriasis, unspecified: Secondary | ICD-10-CM | POA: Insufficient documentation

## 2020-09-09 DIAGNOSIS — Z791 Long term (current) use of non-steroidal anti-inflammatories (NSAID): Secondary | ICD-10-CM | POA: Insufficient documentation

## 2020-09-09 DIAGNOSIS — E039 Hypothyroidism, unspecified: Secondary | ICD-10-CM | POA: Diagnosis not present

## 2020-09-09 DIAGNOSIS — Z923 Personal history of irradiation: Secondary | ICD-10-CM | POA: Insufficient documentation

## 2020-09-09 DIAGNOSIS — Z87891 Personal history of nicotine dependence: Secondary | ICD-10-CM | POA: Diagnosis not present

## 2020-09-09 DIAGNOSIS — Z170 Estrogen receptor positive status [ER+]: Secondary | ICD-10-CM | POA: Diagnosis not present

## 2020-09-09 DIAGNOSIS — Z79899 Other long term (current) drug therapy: Secondary | ICD-10-CM | POA: Insufficient documentation

## 2020-09-09 DIAGNOSIS — Z9013 Acquired absence of bilateral breasts and nipples: Secondary | ICD-10-CM | POA: Diagnosis not present

## 2020-09-09 NOTE — Assessment & Plan Note (Signed)
07/29/20: Bilateral mastectomies ([facility]):  Left breast: no evidence of malignancy  Right breast: intermediate grade DCIS, 1.2cm, clear margins.  Genetics: Negative  Pathology review: I discussed with the patient the difference between DCIS and invasive breast cancer. It is considered a precancerous lesion. DCIS is classified as a 0. It is generally detected through mammograms as calcifications. We discussed the significance of grades and its impact on prognosis. We also discussed the importance of ER and PR receptors and their implications to adjuvant treatment options. Prognosis of DCIS dependence on grade, comedo necrosis. It is anticipated that if not treated, 20-30% of DCIS can develop into invasive breast cancer.  Recommendation: Because she had bilateral mastectomies, breast cancer risk has been minimized and therefore there is no role of adjuvant treatments like radiation or antiestrogen therapy.  Patient will be seen on an as-needed basis.

## 2020-09-27 ENCOUNTER — Ambulatory Visit: Payer: No Typology Code available for payment source | Attending: Surgery

## 2020-09-27 ENCOUNTER — Other Ambulatory Visit: Payer: Self-pay

## 2020-09-27 DIAGNOSIS — R293 Abnormal posture: Secondary | ICD-10-CM | POA: Diagnosis present

## 2020-09-27 DIAGNOSIS — G8929 Other chronic pain: Secondary | ICD-10-CM

## 2020-09-27 DIAGNOSIS — D0511 Intraductal carcinoma in situ of right breast: Secondary | ICD-10-CM

## 2020-09-27 DIAGNOSIS — M25512 Pain in left shoulder: Secondary | ICD-10-CM | POA: Insufficient documentation

## 2020-09-27 DIAGNOSIS — Z483 Aftercare following surgery for neoplasm: Secondary | ICD-10-CM | POA: Diagnosis not present

## 2020-09-27 NOTE — Therapy (Signed)
[place], [place], [address] Phone: [phone]   Fax:  [phone]  Physical Therapy Evaluation  Patient Details  Name: Alicia Mcconnell MRN: [id number] Date of Birth: May 01, 1961 Referring Provider (PT): Dr. Brantley Stage   Encounter Date: 09/27/2020   PT End of Session - 09/27/20 1514    Visit Number 1    Number of Visits 12    Date for PT Re-Evaluation 11/08/20    PT Start Time 1410    PT Stop Time 1455    PT Time Calculation (min) 45 min    Activity Tolerance Patient tolerated treatment well    Behavior During Therapy [facility] for tasks assessed/performed           Past Medical History:  Diagnosis Date  . ALLERGIC RHINITIS 10/08/2009  . Arthritis    psoriatic arthritis-on Enbrel and Methotrexate  . Breast cancer ([place]) 06/2020   Right breast DCIS  . Cancer ([place]) 1996   infiltrating ductal, lumpectomy with axillary node dissection.  ER posivtive     . COLONIC POLYPS 10/08/2009  . Edema 10/08/2009  . Family history of breast cancer   . Family history of prostate cancer   . Graves disease   . HYPERTHYROIDISM 11/03/2009  . Hypothyroidism   . Microscopic hematuria 11/01/2009  . NEOPLASM, MALIGNANT, BREAST, HX OF 10/08/2009  . THYROID FUNCTION TEST, ABNORMAL 11/01/2009    Past Surgical History:  Procedure Laterality Date  . BREAST SURGERY     breast CA, 6 weeks radiation  . CHOLECYSTECTOMY    . labrum tear Right 2/12  . SIMPLE MASTECTOMY WITH AXILLARY SENTINEL NODE BIOPSY Bilateral 07/29/2020   Procedure: BILATERAL SIMPLE MASTECTOMY;  Surgeon: Erroll Luna, MD;  Location: [facility];  Service: General;  Laterality: Bilateral;  PECTORAL BLOCK, RNFA    There were no vitals filed for this visit.    Subjective Assessment - 09/27/20 1410    Subjective Had surgery 07/29/20 for bilateral simple mastectomies  with Right Ductal Carcinoma in Situ ER+, PR +. I have had a rough time.  The right  side got infected where the drain was and I had a bad fever.  Was hospitalized for 1 day and released.  Was put on antibiotics the next day and fevers continued.  Had several rouds of antibiotics because it wasn't cultured. Now she is on last course of Levaquin with 6 days left.   Had bilateral shoulder surgeries labral repair on right, and decompression on left. Presently has a small tear on the left.  Has limited ROM bilaterally since surgery with right the most limited secondary to prior ALND of 28 LN's.  Pecs feel really tight on the right, and left is hard to stretch secondary to shoulder pain. Had alot of necrotic tissue when she had her surgery which was removed by the plastic surgeon. No noticeable swelling in arms or chest area that she is aware of but thinks she has some extra tissue.  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  She is a Careers information officer for patients.    Pertinent History Prior hx of right Breast Cancer in 1996 with ALND of 28 LN's with prior radiation.  Most recent surgery on 07/29/2020 for Bilateral simple mastectomies with Right ductal Carcinoma in Situr ER+,PR+.  Pt has had bilateral shoulder surgeries and is presently having alot of left shoulder joint pain which was to  have been evaluated by [name] but held secondary to her new diagnosis. Believes she has a RTC tear.    Patient Stated Goals Normal ROM, less pain/restriction.    Currently in Pain? No/denies    Pain Score 0-No pain    Effect of Pain on Daily Activities Left shoulder pain with AROM limits reaching;possible RTC tear              [facility] PT Assessment - 09/27/20 0001      Assessment   Medical Diagnosis Right DCIS/Bilateral mastectomies    Referring Provider (PT) Dr. Brantley Stage    Onset Date/Surgical Date 07/29/20    Hand Dominance Right    Prior Therapy yes      Precautions   Precaution Comments lymphedema risk   right     Restrictions    Weight Bearing Restrictions No      Balance Screen   Has the patient fallen in the past 6 months No    Has the patient had a decrease in activity level because of a fear of falling?  No    Is the patient reluctant to leave their home because of a fear of falling?  No      Home Ecologist residence    Living Arrangements Spouse/significant other      Prior Function   Level of [place] Other (comment)   not presently working;out since December   Vocation Requirements nurse ambassador    Leisure walk      Cognition   Overall Cognitive Status Within Functional Limits for tasks assessed      Observation/Other Assessments   Observations wound on left chest cleaning with dial soap and water,and covered with bandaid.    Skin Integrity extra tissue noted left lateral trunk greater than right.   tissue tightness bilateral chest     Posture/Postural Control   Posture/Postural Control Postural limitations    Postural Limitations Rounded Shoulders;Forward head      AROM   Overall AROM Comments Left shoulder with potential RTC tear    Right Shoulder Extension 54 Degrees    Right Shoulder Flexion 149 Degrees    Right Shoulder ABduction 142 Degrees    Right Shoulder Internal Rotation 54 Degrees    Right Shoulder External Rotation 94 Degrees    Left Shoulder Extension 41 Degrees    Left Shoulder Flexion 140 Degrees    Left Shoulder ABduction 160 Degrees    Left Shoulder Internal Rotation --   not assessed secondary to pain, ?tear   Left Shoulder External Rotation --   not assessed secondary to pain/?tear            LYMPHEDEMA/ONCOLOGY QUESTIONNAIRE - 09/27/20 0001      Type   Cancer Type Right DCIS      Surgeries   Mastectomy Date 07/29/20   bilateral mastectomy (right involved)   Lumpectomy Date --   1996 right   Axillary Lymph Node Dissection Date --   1996 on Right 28 LN   Number Lymph Nodes Removed 28   right 1996      Treatment   Active Chemotherapy Treatment No    Past Chemotherapy Treatment No    Active Radiation Treatment No    Past Radiation Treatment Yes   1996   Current Hormone Treatment No    Past Hormone Therapy No      What other symptoms do you have   Are you Having  Heaviness or Tightness Yes    Are you having Pain Yes   scar tissue/ left shoulder   Are you having pitting edema No    Is it Hard or Difficult finding clothes that fit No    Do you have infections Yes   from right drain   Comments at drain site    Is there Decreased scar mobility Yes    [place] Sign No      Right Upper Extremity Lymphedema   At Axilla  36.5 cm    15 cm Proximal to Olecranon Process 34.4 cm    10 cm Proximal to Olecranon Process 31.7 cm    Olecranon Process 26.7 cm    15 cm Proximal to Ulnar Styloid Process 23.2 cm    10 cm Proximal to Ulnar Styloid Process 19.2 cm    Just Proximal to Ulnar Styloid Process 15.8 cm    Across Hand at [employer] 19.8 cm    At [place] of 2nd Digit 6.2 cm      Left Upper Extremity Lymphedema   15 cm Proximal to Olecranon Process 35.2 cm    10 cm Proximal to Olecranon Process 33.3 cm    Olecranon Process 26.2 cm    15 cm Proximal to Ulnar Styloid Process 22.1 cm    10 cm Proximal to Ulnar Styloid Process 18.6 cm    Just Proximal to Ulnar Styloid Process 15.9 cm    Across Hand at [employer] 20 cm    At [place] of 2nd Digit 5.9 cm                 Quick Dash - 09/27/20 0001    Open a tight or new jar Moderate difficulty    Do heavy household chores (wash walls, wash floors) Moderate difficulty    Carry a shopping bag or briefcase Mild difficulty    Wash your back Moderate difficulty    Use a knife to cut food Mild difficulty    Recreational activities in which you take some force or impact through your arm, shoulder, or hand (golf, hammering, tennis) Mild difficulty    During the past week, to what extent has your arm, shoulder or hand problem interfered  with your normal social activities with family, friends, neighbors, or groups? Modererately    During the past week, to what extent has your arm, shoulder or hand problem limited your work or other regular daily activities Modererately    Arm, shoulder, or hand pain. Moderate    Tingling (pins and needles) in your arm, shoulder, or hand Mild    Difficulty Sleeping Moderate difficulty    DASH Score 40.91 %            Objective measurements completed on examination: See above findings.                    PT Long Term Goals - 09/27/20 1756      PT LONG TERM GOAL #1   Title pt will be independent in HEP for bilateral shoulder ROM    Time 4    Period Weeks    Status New    Target Date 10/25/20      PT LONG TERM GOAL #2   Title Quick dash will be no greater than 20%    Baseline 41%    Time 6    Period Weeks    Status New    Target Date 11/08/20  PT LONG TERM GOAL #3   Title Pts AROM shoulders flexion and abd will increase to atleast 155 degrees for improved reaching ability    Time 4    Period Weeks    Status New    Target Date 11/08/20      PT LONG TERM GOAL #4   Title Pt will have decreased complaints of tightness by atleast 50%    Time 6    Period Weeks    Status New    Target Date 11/08/20      PT LONG TERM GOAL #5   Title pt will be fit for appropriate compression garments (bra/sleeve) prn    Time 6    Period Weeks    Status New    Target Date 11/08/20                  Plan - 09/27/20 1748    Clinical Impression Statement Therapy consisted of assessment of shoulder AROM, circumferential measuring, and assessment of incisions.  RX was limited today secondary to pt had an important call to take and we were late getting started.  She will benefit from skilled PT to address ROM restrictions being cautious with Left shoulder ROM with potential tear of RTC. She will also benefit from [facility] to tight regions in pecs, lats etc, MFR prn and PROM.  Circumferential measurements WNL today. Pt. needs to be educated about ABC class. she will benefit from compression sleeve for prophylactic reasons on right and compression bra.    Personal Factors and Comorbidities Comorbidity 3+    Comorbidities prior Breast CA with radiation, psoriatic arthritis, Left shoulder Pain (potential RTC tear)    Stability/Clinical Decision Making Stable/Uncomplicated    Clinical Decision Making Low    Rehab Potential Good    PT Frequency 2x / week    PT Duration 6 weeks    PT Treatment/Interventions ADLs/Self Care Home Management;Therapeutic exercise;Patient/family education;Manual techniques;Manual lymph drainage;Scar mobilization;Joint Manipulations    PT Next Visit Plan Discuss ABC class and schedule, Start supine AAROM exs (caution with left shoulder), check incision, MFR prn, check for cording   Recommended Other Services ABC class/compression bra/sleeve   Consulted and Agree with Plan of Care Patient           Patient will benefit from skilled therapeutic intervention in order to improve the following deficits and impairments:  Decreased skin integrity,Increased fascial restricitons,Impaired sensation,Decreased scar mobility,Postural dysfunction,Decreased activity tolerance,Decreased range of motion,Impaired UE functional use,Decreased knowledge of precautions,Increased edema,Impaired flexibility  Visit Diagnosis: Aftercare following surgery for neoplasm  Abnormal posture  Chronic left shoulder pain  Intraductal carcinoma in situ of right breast     Problem List Patient Active Problem List   Diagnosis Date Noted  . Rash 08/12/2020  . Genetic testing 07/26/2020  . Ductal carcinoma in situ (DCIS) of right breast 07/05/2020  . Family history of breast cancer   . Family history of prostate cancer   . Lumbar paraspinal muscle spasm 10/16/2018  . Chronic insomnia 05/31/2015  . Hypothyroidism following radioiodine therapy 05/15/2011  .  HYPERTHYROIDISM 11/03/2009  . MICROSCOPIC HEMATURIA 11/01/2009  . THYROID FUNCTION TEST, ABNORMAL 11/01/2009  . COLONIC POLYPS 10/08/2009  . EDEMA 10/08/2009  . NEOPLASM, MALIGNANT, BREAST, HX OF 10/08/2009    Claris Pong 09/27/2020, 6:04 PM  [place], [place], [address] Phone: [phone]   Fax:  [phone]  Name: LALENA SALAS MRN: [id number] Date of Birth: 1960/07/30 Shirlean Mylar  Mekhi Lascola, PT 09/27/20 6:06 PM

## 2020-09-29 ENCOUNTER — Other Ambulatory Visit: Payer: Self-pay

## 2020-09-29 ENCOUNTER — Ambulatory Visit: Payer: No Typology Code available for payment source

## 2020-09-29 DIAGNOSIS — Z483 Aftercare following surgery for neoplasm: Secondary | ICD-10-CM

## 2020-09-29 DIAGNOSIS — G8929 Other chronic pain: Secondary | ICD-10-CM

## 2020-09-29 DIAGNOSIS — R293 Abnormal posture: Secondary | ICD-10-CM

## 2020-09-29 DIAGNOSIS — D0511 Intraductal carcinoma in situ of right breast: Secondary | ICD-10-CM

## 2020-09-29 DIAGNOSIS — M25512 Pain in left shoulder: Secondary | ICD-10-CM

## 2020-09-29 NOTE — Therapy (Signed)
[place], [place], [address] Phone: [phone]   Fax:  [phone]  Physical Therapy Treatment  Patient Details  Name: Alicia Mcconnell MRN: [id number] Date of Birth: [birth year] Referring Provider (PT): Dr. Brantley Stage   Encounter Date: 09/29/2020   PT End of Session - 09/29/20 1339    Visit Number 2    Number of Visits 12    Date for PT Re-Evaluation 11/08/20    PT Start Time 1213    PT Stop Time 1314    PT Time Calculation (min) 61 min    Activity Tolerance Patient tolerated treatment well    Behavior During Therapy [facility] for tasks assessed/performed           Past Medical History:  Diagnosis Date  . ALLERGIC RHINITIS 10/08/2009  . Arthritis    psoriatic arthritis-on Enbrel and Methotrexate  . 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 Cancer ([place]) 1996   infiltrating ductal, lumpectomy with axillary node dissection.  ER posivtive     . COLONIC POLYPS 10/08/2009  . Edema 10/08/2009  . Family history of breast cancer   . Family history of prostate cancer   . Graves disease   . HYPERTHYROIDISM 11/03/2009  . Hypothyroidism   . Microscopic hematuria 11/01/2009  . NEOPLASM, MALIGNANT, BREAST, HX OF 10/08/2009  . THYROID FUNCTION TEST, ABNORMAL 11/01/2009    Past Surgical History:  Procedure Laterality Date  . BREAST SURGERY     breast CA, 6 weeks radiation  . CHOLECYSTECTOMY    . labrum tear Right 2/12  . SIMPLE MASTECTOMY WITH AXILLARY SENTINEL NODE BIOPSY Bilateral 07/29/2020   Procedure: BILATERAL SIMPLE MASTECTOMY;  Surgeon: Erroll Luna, MD;  Location: [facility];  Service: General;  Laterality: Bilateral;  PECTORAL BLOCK, RNFA    There were no vitals filed for this visit.   Subjective Assessment - 09/29/20 1216    Subjective Some of the stretches I'm doing from previous shoulder physical therapy have aggravated my Lt RTC so I change them prn to work within pain  tolerance. The areas on each mastectomy incisions the the plastic surgeon debrided last week appear to be healing well and I am so thankful he did that for me.    Pertinent History Prior hx of right Breast Cancer in 1996 with ALND of 28 LN's with prior radiation.  Most recent surgery on 07/29/2020 for Bilateral simple mastectomies with Right ductal Carcinoma in Situr ER+,PR+.  Pt has had bilateral shoulder surgeries and is presently having alot of left shoulder joint pain which was to have been evaluated by [name] but held secondary to her new diagnosis. Believes she has a RTC tear.    Patient Stated Goals Normal ROM, less pain/restriction.    Currently in Pain? No/denies                             [facility] Adult PT Treatment/Exercise - 09/29/20 0001      Self-Care   Self-Care Other Self-Care Comments    Other Self-Care Comments  Spent beginning of session having pt answer questions about current status about healing small wounds at Lt>Rt mastectomy incisions from where she had necrotic tissue debrided last week. Pt became emotional during this time sharing her frustration with her journey thus far. Also how her Lt shoulder is really bothering her, even with gentle stretchces at home so we discussed other positions  she can stretch in and ways to avoid pain with stretching. Also encouraged pt to R/S appt she had with [name] that she had to cancel when she got diagnosed with recent breast cancer.      Shoulder Exercises: Supine   Flexion AAROM;Both   with dowel   Flexion Limitations attempted this but increased pain in Lt shoulder that made pt immediately tearful so stopped      Manual Therapy   Manual Therapy Myofascial release;Passive ROM    Myofascial Release In Supine to Rt axilla during P/ROM, also to pect insertion; cording was noted by pt and therapist anterior to pect insertion at end shoulder abd so focused MFR here too    Passive ROM In Supine: Rt shoulder into  flexion, abduction and D2 to pts available end motions; then to Lt shoulder slowly and gently into flexion and scaption, pt able to relax well but reports feeling herself trying not to muscle guard so only did Lt for short period of time.                       PT Long Term Goals - 09/27/20 1756      PT LONG TERM GOAL #1   Title pt will be independent in HEP for bilateral shoulder ROM    Time 4    Period Weeks    Status New    Target Date 10/25/20      PT LONG TERM GOAL #2   Title Quick dash will be no greater than 20%    Baseline 41%    Time 6    Period Weeks    Status New    Target Date 11/08/20      PT LONG TERM GOAL #3   Title Pts AROM shoulders flexion and abd will increase to atleast 155 degrees for improved reaching ability    Time 4    Period Weeks    Status New    Target Date 11/08/20      PT LONG TERM GOAL #4   Title Pt will have decreased complaints of tightness by atleast 50%    Time 6    Period Weeks    Status New    Target Date 11/08/20      PT LONG TERM GOAL #5   Title pt will be fit for appropriate compression garments (bra/sleeve) prn    Time 6    Period Weeks    Status New    Target Date 11/08/20                 Plan - 09/29/20 1339    Clinical Impression Statement First session of manual therapy today. Pt required min-mod VCs to relax Rt shoulder due to muscle guarding but was able to do so with cuing. She tolerated P/ROM very well and this improved by end of session and pt able to report her shoulder feeling much looser as well. Cording noted in her Rt axilla anterior to Rt pect insetion which was noted by pt and therapist. Encouraged pt that this will improve as her flexibility conts to improve. Pt was very emotional at varying times during session today due to not feelig ready to go back to work with just having incisions debrided last week and her Lt shoulder hurting so much. Encouraged her to R/S appt she had with [name]  but had to cancel due to being diagnosed with breast cancer. Also encouraged her to speak with  Dr. Brantley Stage about her not returning to work until Apr 4. Pt agreed and reports will work to R/S appt with [name] as even AA/ROM increased pain today, and will discuss when to return to work at next appt with Dr. Brantley Stage this week.    Personal Factors and Comorbidities Comorbidity 3+    Comorbidities prior Breast CA with radiation, psoriatic arthritis, Left shoulder Pain (potential RTC tear)    Stability/Clinical Decision Making Stable/Uncomplicated    Rehab Potential Good    PT Frequency 2x / week    PT Duration 6 weeks    PT Treatment/Interventions ADLs/Self Care Home Management;Therapeutic exercise;Patient/family education;Manual techniques;Manual lymph drainage;Scar mobilization;Joint Manipulations    PT Next Visit Plan Discuss ABC class and schedule, try AAROM exs that don't incr left shoulder pain, check incision, MFR to Rt axilla/cording; how was appt with Dr. Brantley Stage?    Consulted and Agree with Plan of Care Patient           Patient will benefit from skilled therapeutic intervention in order to improve the following deficits and impairments:  Decreased skin integrity,Increased fascial restricitons,Impaired sensation,Decreased scar mobility,Postural dysfunction,Decreased activity tolerance,Decreased range of motion,Impaired UE functional use,Decreased knowledge of precautions,Increased edema,Impaired flexibility  Visit Diagnosis: Aftercare following surgery for neoplasm  Abnormal posture  Chronic left shoulder pain  Intraductal carcinoma in situ of right breast     Problem List Patient Active Problem List   Diagnosis Date Noted  . Rash 08/12/2020  . Genetic testing 07/26/2020  . Ductal carcinoma in situ (DCIS) of right breast 07/05/2020  . Family history of breast cancer   . Family history of prostate cancer   . Lumbar paraspinal muscle spasm 10/16/2018  . Chronic  insomnia 05/31/2015  . Hypothyroidism following radioiodine therapy 05/15/2011  . HYPERTHYROIDISM 11/03/2009  . MICROSCOPIC HEMATURIA 11/01/2009  . THYROID FUNCTION TEST, ABNORMAL 11/01/2009  . COLONIC POLYPS 10/08/2009  . EDEMA 10/08/2009  . NEOPLASM, MALIGNANT, BREAST, HX OF 10/08/2009    Otelia Limes, PTA 09/29/2020, 1:46 PM  [place], [place], [address] Phone: [phone]   Fax:  [phone]  Name: Alicia Mcconnell MRN: [id number] Date of Birth: 1961-01-28

## 2020-10-05 ENCOUNTER — Other Ambulatory Visit: Payer: Self-pay

## 2020-10-05 ENCOUNTER — Ambulatory Visit: Payer: No Typology Code available for payment source

## 2020-10-05 DIAGNOSIS — D0511 Intraductal carcinoma in situ of right breast: Secondary | ICD-10-CM

## 2020-10-05 DIAGNOSIS — Z483 Aftercare following surgery for neoplasm: Secondary | ICD-10-CM

## 2020-10-05 DIAGNOSIS — M25512 Pain in left shoulder: Secondary | ICD-10-CM

## 2020-10-05 DIAGNOSIS — R293 Abnormal posture: Secondary | ICD-10-CM

## 2020-10-05 DIAGNOSIS — G8929 Other chronic pain: Secondary | ICD-10-CM

## 2020-10-05 NOTE — Therapy (Signed)
[place], [place], [address] Phone: [phone]   Fax:  [phone]  Physical Therapy Treatment  Patient Details  Name: Alicia Mcconnell MRN: [id number] Date of Birth: 05/17/1961 Referring Provider (PT): Dr. Brantley Stage   Encounter Date: 10/05/2020   PT End of Session - 10/05/20 1254    Visit Number 3    Number of Visits 12    Date for PT Re-Evaluation 11/08/20    PT Start Time 1152    PT Stop Time 1253    PT Time Calculation (min) 61 min    Activity Tolerance Patient tolerated treatment well    Behavior During Therapy [facility] for tasks assessed/performed           Past Medical History:  Diagnosis Date  . ALLERGIC RHINITIS 10/08/2009  . Arthritis    psoriatic arthritis-on Enbrel and Methotrexate  . 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 Cancer ([place]) 1996   infiltrating ductal, lumpectomy with axillary node dissection.  ER posivtive     . COLONIC POLYPS 10/08/2009  . Edema 10/08/2009  . Family history of breast cancer   . Family history of prostate cancer   . Graves disease   . HYPERTHYROIDISM 11/03/2009  . Hypothyroidism   . Microscopic hematuria 11/01/2009  . NEOPLASM, MALIGNANT, BREAST, HX OF 10/08/2009  . THYROID FUNCTION TEST, ABNORMAL 11/01/2009    Past Surgical History:  Procedure Laterality Date  . BREAST SURGERY     breast CA, 6 weeks radiation  . CHOLECYSTECTOMY    . labrum tear Right 2/12  . SIMPLE MASTECTOMY WITH AXILLARY SENTINEL NODE BIOPSY Bilateral 07/29/2020   Procedure: BILATERAL SIMPLE MASTECTOMY;  Surgeon: Erroll Luna, MD;  Location: [facility];  Service: General;  Laterality: Bilateral;  PECTORAL BLOCK, RNFA    There were no vitals filed for this visit.   Subjective Assessment - 10/05/20 1158    Subjective I've been doing the stretches I've been doing from old PT I've had from previous shoulder injuries. I got an appt with Dr. Durward Mcconnell  tomorrow to start looking into my Lt shoulder again.    Pertinent History Prior hx of right Breast Cancer in 1996 with ALND of 28 LN's with prior radiation.  Most recent surgery on 07/29/2020 for Bilateral simple mastectomies with Right ductal Carcinoma in Situr ER+,PR+.  Pt has had bilateral shoulder surgeries and is presently having alot of left shoulder joint pain which was to have been evaluated by Dr. Durward Mcconnell but held secondary to her new diagnosis. Believes she has a RTC tear.    Patient Stated Goals Normal ROM, less pain/restriction.    Currently in Pain? No/denies                             [facility] Adult PT Treatment/Exercise - 10/05/20 0001      Manual Therapy   Manual Therapy Myofascial release;Passive ROM    Myofascial Release In Supine to Rt axilla during P/ROM, also to pect insertion; cording was noted by pt and therapist anterior to pect insertion at end shoulder abd so focused MFR here too    Passive ROM In Supine: Rt shoulder into flexion, abduction and D2 to pts available end motions; then to Lt shoulder slowly and gently into flexion and scaption, pt able to relax well but reports feeling herself trying not to muscle guard so only did Lt for short period of  time.                       PT Long Term Goals - 09/27/20 1756      PT LONG TERM GOAL #1   Title pt will be independent in HEP for bilateral shoulder ROM    Time 4    Period Weeks    Status New    Target Date 10/25/20      PT LONG TERM GOAL #2   Title Quick dash will be no greater than 20%    Baseline 41%    Time 6    Period Weeks    Status New    Target Date 11/08/20      PT LONG TERM GOAL #3   Title Pts AROM shoulders flexion and abd will increase to atleast 155 degrees for improved reaching ability    Time 4    Period Weeks    Status New    Target Date 11/08/20      PT LONG TERM GOAL #4   Title Pt will have decreased complaints of tightness by atleast 50%    Time 6     Period Weeks    Status New    Target Date 11/08/20      PT LONG TERM GOAL #5   Title pt will be fit for appropriate compression garments (bra/sleeve) prn    Time 6    Period Weeks    Status New    Target Date 11/08/20                 Plan - 10/05/20 1254    Clinical Impression Statement Continued with manual therapy to bil upper quadrants. P/ROM to Rt>Lt shoulders including MFR to Rt axilla. Also included STM to bil pectoralis being mindful of not stretching still healing Lt mastectomy incision. Pt was able to tolerate Lt shoulder P/ROM gently but end motion is very limited by pain. She sees Dr. Durward Mcconnell tomorrow for this.    Personal Factors and Comorbidities Comorbidity 3+    Comorbidities prior Breast CA with radiation, psoriatic arthritis, Left shoulder Pain (potential RTC tear)    Stability/Clinical Decision Making Stable/Uncomplicated    Rehab Potential Good    PT Frequency 2x / week    PT Duration 6 weeks    PT Treatment/Interventions ADLs/Self Care Home Management;Therapeutic exercise;Patient/family education;Manual techniques;Manual lymph drainage;Scar mobilization;Joint Manipulations    PT Next Visit Plan How was appt with Dr. Durward Mcconnell? Instruct in lymphedema risk reduction and infection prevention as pt reports won't be able to attend ABC class due to work conflict, cont to try AAROM exs that don't incr left shoulder pain, check still healing Lt mastectomy incision, MFR to Rt axilla/cording    PT Home Exercise Plan Cont gentle stretching/ A/ROM of bil shoulders being mindful of not increasing pain on Lt    Consulted and Agree with Plan of Care Patient           Patient will benefit from skilled therapeutic intervention in order to improve the following deficits and impairments:  Decreased skin integrity,Increased fascial restricitons,Impaired sensation,Decreased scar mobility,Postural dysfunction,Decreased activity tolerance,Decreased range of motion,Impaired UE  functional use,Decreased knowledge of precautions,Increased edema,Impaired flexibility  Visit Diagnosis: Aftercare following surgery for neoplasm  Abnormal posture  Chronic left shoulder pain  Intraductal carcinoma in situ of right breast     Problem List Patient Active Problem List   Diagnosis Date Noted  . Rash 08/12/2020  . Genetic testing 07/26/2020  .  Ductal carcinoma in situ (DCIS) of right breast 07/05/2020  . Family history of breast cancer   . Family history of prostate cancer   . Lumbar paraspinal muscle spasm 10/16/2018  . Chronic insomnia 05/31/2015  . Hypothyroidism following radioiodine therapy 05/15/2011  . HYPERTHYROIDISM 11/03/2009  . MICROSCOPIC HEMATURIA 11/01/2009  . THYROID FUNCTION TEST, ABNORMAL 11/01/2009  . COLONIC POLYPS 10/08/2009  . EDEMA 10/08/2009  . NEOPLASM, MALIGNANT, BREAST, HX OF 10/08/2009    Otelia Limes, PTA 10/05/2020, 1:09 PM  [place], [place], [address] Phone: [phone]   Fax:  [phone]  Name: KEYLEN UZELAC MRN: [id number] Date of Birth: Nov 27, 1960

## 2020-10-06 ENCOUNTER — Ambulatory Visit (INDEPENDENT_AMBULATORY_CARE_PROVIDER_SITE_OTHER): Payer: No Typology Code available for payment source | Admitting: Orthopaedic Surgery

## 2020-10-06 ENCOUNTER — Encounter: Payer: Self-pay | Admitting: Orthopaedic Surgery

## 2020-10-06 DIAGNOSIS — M25512 Pain in left shoulder: Secondary | ICD-10-CM

## 2020-10-06 DIAGNOSIS — G8929 Other chronic pain: Secondary | ICD-10-CM

## 2020-10-06 MED ORDER — BUPIVACAINE HCL 0.25 % IJ SOLN
2.0000 mL | INTRAMUSCULAR | Status: AC | PRN
  Administered 2020-10-06: 2 mL via INTRA_ARTICULAR

## 2020-10-06 MED ORDER — LIDOCAINE HCL 1 % IJ SOLN
2.0000 mL | INTRAMUSCULAR | Status: AC | PRN
  Administered 2020-10-06: 2 mL

## 2020-10-06 NOTE — Progress Notes (Signed)
Office Visit Note   Patient: Alicia Mcconnell           Date of Birth: Dec 02, 1960           MRN: [id number] Visit Date: 10/06/2020              Requested by: Thurman Coyer, DO [address],  [place] [address] PCP: Eulas Post, MD   Assessment & Plan: Visit Diagnoses:  1. Chronic left shoulder pain     Plan: Alicia Mcconnell has a long history of problems referable to her left shoulder.  She had a shoulder arthroscopy by Dr. Ivin Booty in [place] in 2016 that included an arthroscopic debridement and distal clavicle resection.  She notes that she never "completely recovered" from that surgery but thinks that she had an exacerbation of her pain over the past year and a half after doing some heavy lifting and working out with weights.  She has been seen by her primary care physician and underwent a course of therapy.  She has been using Mobic.  She is having difficulty sleeping and placing her arm over her head.  She had an MRI scan performed in August of last year that demonstrated a prior distal clavicle resection and subacromial decompression with a type I acromion.  Biceps long head was intact.  No muscle atrophy or edema.  There was some mild tendinosis of the supraspinatus tendon with fraying along the articular surface.  Infraspinatus, teres minor and subscapularis were intact.  There was partial thickness cartilage loss of the glenohumeral joint but no effusion or chondral defects.  She had films of her left shoulder performed in January 2021.  I reviewed these and thought she had a very small inferior humeral head spur but the joint space was well-maintained.  In the interim she is also had an ultrasound that she relates demonstrated possible rotator cuff tear.  She is just frustrated with her pain.  Long discussion over half an hour or more regarding all of the above.  She may be a candidate for arthroscopic debridement but I think it is worth starting with an  intra-articular cortisone injection and monitoring her response.  It appears based on the MRI scan that her biggest problem is with the arthritis. I had performed a left shoulder arthroscopy and apparent labral repair 10 years ago and she is done very well thus the reason for coming to the office.  Follow-Up Instructions: Return if symptoms worsen or fail to improve.   Orders:  Orders Placed This Encounter  Procedures  . Large Joint Inj: L glenohumeral   No orders of the defined types were placed in this encounter.     Procedures: Large Joint Inj: L glenohumeral on 10/06/2020 3:54 PM Details: 25 G 1.5 in needle, anteromedial approach  Arthrogram: No  Medications: 2 mL lidocaine 1 %; 2 mL bupivacaine 0.25 %  12 mg betamethasone injected into the glenohumeral joint left shoulder with Xylocaine and Marcaine Procedure, treatment alternatives, risks and benefits explained, specific risks discussed. Consent was given by the patient. Immediately prior to procedure a time out was called to verify the correct patient, procedure, equipment, support staff and site/side marked as required. Patient was prepped and draped in the usual sterile fashion.       Clinical Data: No additional findings.   Subjective: Chief Complaint  Patient presents with  . Left Shoulder - Pain  Patient presents today for her left shoulder. She said that it  has been hurting for over a year. She has limited range of motion. She has already had an MRI and x-rays done. She states that she feels like she may need surgery, but cannot have surgery right now because she just had a bilateral mastectomy in January of this year. She is right hand dominant. She is taking Mobic for pain relief.  Had bilateral mastectomies in January and still recuperating  HPI  Review of Systems   Objective: Vital Signs: Ht 5\' 7"  (1.702 m)   Wt 190 lb (86.2 kg)   BMI 29.76 kg/m   Physical Exam Constitutional:      Appearance: She  is well-developed.  Eyes:     Pupils: Pupils are equal, round, and reactive to light.  Pulmonary:     Effort: Pulmonary effort is normal.  Skin:    General: Skin is warm and dry.  Neurological:     Mental Status: She is alert and oriented to person, place, and time.  Psychiatric:        Behavior: Behavior normal.     Ortho Exam awake alert and oriented x3 and comfortable sitting.  Able to place left arm fully overhead with a circuitous arc of motion and actually had some discomfort while bringing her arm down to her side.  Positive impingement and a little bit of crepitation with external rotation.  A little loss of external rotation.  Biceps intact.  Positive empty can testing.  Appears to have good strength Specialty Comments:  No specialty comments available.  Imaging: No results found.   PMFS History: Patient Active Problem List   Diagnosis Date Noted  . Pain in left shoulder 10/06/2020  . Rash 08/12/2020  . Genetic testing 07/26/2020  . Ductal carcinoma in situ (DCIS) of right breast 07/05/2020  . Family history of breast cancer   . Family history of prostate cancer   . Lumbar paraspinal muscle spasm 10/16/2018  . Chronic insomnia 05/31/2015  . Hypothyroidism following radioiodine therapy 05/15/2011  . HYPERTHYROIDISM 11/03/2009  . MICROSCOPIC HEMATURIA 11/01/2009  . THYROID FUNCTION TEST, ABNORMAL 11/01/2009  . COLONIC POLYPS 10/08/2009  . EDEMA 10/08/2009  . NEOPLASM, MALIGNANT, BREAST, HX OF 10/08/2009   Past Medical History:  Diagnosis Date  . ALLERGIC RHINITIS 10/08/2009  . Arthritis    psoriatic arthritis-on Enbrel and Methotrexate  . 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 Cancer ([place]) 1996   infiltrating ductal, lumpectomy with axillary node dissection.  ER posivtive     . COLONIC POLYPS 10/08/2009  . Edema 10/08/2009  . Family history of breast cancer   . Family history of prostate cancer   . Graves disease   . HYPERTHYROIDISM 11/03/2009  .  Hypothyroidism   . Microscopic hematuria 11/01/2009  . NEOPLASM, MALIGNANT, BREAST, HX OF 10/08/2009  . THYROID FUNCTION TEST, ABNORMAL 11/01/2009    Family History  Problem Relation Age of Onset  . Arthritis Mother 69       RA  . Alcohol abuse Father   . Hypertension Father   . COPD Father        d. 77  . Alcohol abuse Sister   . Alcohol abuse Brother   . Breast cancer Paternal Aunt        x2 dx  . Heart attack Maternal Grandfather   . Prostate cancer Paternal Grandfather        dx 23s  . Healthy Daughter     Past Surgical History:  Procedure Laterality Date  .  BREAST SURGERY     breast CA, 6 weeks radiation  . CHOLECYSTECTOMY    . labrum tear Right 2/12  . SIMPLE MASTECTOMY WITH AXILLARY SENTINEL NODE BIOPSY Bilateral 07/29/2020   Procedure: BILATERAL SIMPLE MASTECTOMY;  Surgeon: Erroll Luna, MD;  Location: [facility];  Service: General;  Laterality: Bilateral;  PECTORAL BLOCK, RNFA   Social History   Occupational History  . Not on file  Tobacco Use  . Smoking status: Former Smoker    Packs/day: 0.50    Years: 25.00    Pack years: 12.50    Types: Cigarettes    Quit date: 06/29/2020    Years since quitting: 0.2  . Smokeless tobacco: Never Used  Substance and Sexual Activity  . Alcohol use: Yes    Alcohol/week: 0.0 standard drinks    Comment: rare  . Drug use: No  . Sexual activity: Not Currently    Birth control/protection: Post-menopausal

## 2020-10-07 ENCOUNTER — Ambulatory Visit: Payer: No Typology Code available for payment source

## 2020-10-07 ENCOUNTER — Other Ambulatory Visit: Payer: Self-pay

## 2020-10-07 DIAGNOSIS — Z483 Aftercare following surgery for neoplasm: Secondary | ICD-10-CM | POA: Diagnosis not present

## 2020-10-07 DIAGNOSIS — R293 Abnormal posture: Secondary | ICD-10-CM

## 2020-10-07 DIAGNOSIS — D0511 Intraductal carcinoma in situ of right breast: Secondary | ICD-10-CM

## 2020-10-07 DIAGNOSIS — G8929 Other chronic pain: Secondary | ICD-10-CM

## 2020-10-07 DIAGNOSIS — M25512 Pain in left shoulder: Secondary | ICD-10-CM

## 2020-10-07 NOTE — Therapy (Signed)
[place], [place], [address] Phone: [phone]   Fax:  [phone]  Physical Therapy Treatment  Patient Details  Name: Alicia Mcconnell MRN: [id number] Date of Birth: 1961-03-21 Referring Provider (PT): Dr. Brantley Stage   Encounter Date: 10/07/2020   PT End of Session - 10/07/20 1722    Visit Number 4    Number of Visits 12    Date for PT Re-Evaluation 11/08/20    PT Start Time 0406    PT Stop Time 0455    PT Time Calculation (min) 49 min    Activity Tolerance Patient tolerated treatment well    Behavior During Therapy [facility] for tasks assessed/performed           Past Medical History:  Diagnosis Date  . ALLERGIC RHINITIS 10/08/2009  . Arthritis    psoriatic arthritis-on Enbrel and Methotrexate  . 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 Cancer ([place]) 1996   infiltrating ductal, lumpectomy with axillary node dissection.  ER posivtive     . COLONIC POLYPS 10/08/2009  . Edema 10/08/2009  . Family history of breast cancer   . Family history of prostate cancer   . Graves disease   . HYPERTHYROIDISM 11/03/2009  . Hypothyroidism   . Microscopic hematuria 11/01/2009  . NEOPLASM, MALIGNANT, BREAST, HX OF 10/08/2009  . THYROID FUNCTION TEST, ABNORMAL 11/01/2009    Past Surgical History:  Procedure Laterality Date  . BREAST SURGERY     breast CA, 6 weeks radiation  . CHOLECYSTECTOMY    . labrum tear Right 2/12  . SIMPLE MASTECTOMY WITH AXILLARY SENTINEL NODE BIOPSY Bilateral 07/29/2020   Procedure: BILATERAL SIMPLE MASTECTOMY;  Surgeon: Erroll Luna, MD;  Location: [facility] [place];  Service: General;  Laterality: Bilateral;  PECTORAL BLOCK, RNFA    There were no vitals filed for this visit.   Subjective Assessment - 10/07/20 1606    Subjective Got an injection in the left shoulder yesterday and its still sore.  I know I have arthritis but I don't think thats where the pain is. I  think I have an unstable shoulder. Able to do the shoulder ROM exs given as long as I don't actively use my left shoulder to reach overhead.    Pertinent History Prior hx of right Breast Cancer in 1996 with ALND of 28 LN's with prior radiation.  Most recent surgery on 07/29/2020 for Bilateral simple mastectomies with Right ductal Carcinoma in Situr ER+,PR+.  Pt has had bilateral shoulder surgeries and is presently having alot of left shoulder joint pain which was to have been evaluated by [name] but held secondary to her new diagnosis. Believes she has a RTC tear.    Patient Stated Goals Normal ROM, less pain/restriction.    Currently in Pain? No/denies                             [facility] Adult PT Treatment/Exercise - 10/07/20 0001      Self-Care   Self-Care Other Self-Care Comments    Other Self-Care Comments  lengthy discussion with pt about her appt with [name].  Had injection but doesn't feel she has arthritis.  Advised to perform supine PROM for shoulder and nothing in sitting/standing.  Also checked left mastectomy incision. Advised gently dabbing incision to remove yellowish tissue if possible but no scrubbing.      Shoulder Exercises: Supine   Flexion  AAROM;Both;5 reps   clasped hands   Flexion Limitations to stop before point of pain and to practice depressing scapula      Manual Therapy   Manual Therapy Myofascial release;Passive ROM;Soft tissue mobilization    Soft tissue mobilization To bilateral pecs/axillary border with cocobutter    Myofascial Release In Supine to Rt axilla during P/ROM, also to pect insertion; cording noted bilateral axilla and MFR performed here as well.  Gentle MFR right and left chest    Passive ROM In Supine: Rt shoulder into flexion, abduction and D2 to pts available end motions; then to Lt shoulder slowly and gently into flexion and scaption, and ER pt able to relax well and could do with min. complaints if she depressed scapula.                        PT Long Term Goals - 09/27/20 1756      PT LONG TERM GOAL #1   Title pt will be independent in HEP for bilateral shoulder ROM    Time 4    Period Weeks    Status New    Target Date 10/25/20      PT LONG TERM GOAL #2   Title Quick dash will be no greater than 20%    Baseline 41%    Time 6    Period Weeks    Status New    Target Date 11/08/20      PT LONG TERM GOAL #3   Title Pts AROM shoulders flexion and abd will increase to atleast 155 degrees for improved reaching ability    Time 4    Period Weeks    Status New    Target Date 11/08/20      PT LONG TERM GOAL #4   Title Pt will have decreased complaints of tightness by atleast 50%    Time 6    Period Weeks    Status New    Target Date 11/08/20      PT LONG TERM GOAL #5   Title pt will be fit for appropriate compression garments (bra/sleeve) prn    Time 6    Period Weeks    Status New    Target Date 11/08/20                 Plan - 10/07/20 1722    Clinical Impression Statement Pt received a left shoulder injection yesterday however her shoulder is still very sore.  Performed soft tissue work to bilateral pectoralis and axillary border, MFR to areas of cording in axilla, and to bilateral chest, and PROM performed to bilateral shoulders.  Pt did well relaxing for PROM and was able to do with minimal pain if she was cued to depress scapula    Personal Factors and Comorbidities Comorbidity 3+    Comorbidities prior Breast CA with radiation, psoriatic arthritis, Left shoulder Pain (potential RTC tear)    Stability/Clinical Decision Making Stable/Uncomplicated    Rehab Potential Good    PT Frequency 2x / week    PT Duration 6 weeks    PT Treatment/Interventions ADLs/Self Care Home Management;Therapeutic exercise;Patient/family education;Manual techniques;Manual lymph drainage;Scar mobilization;Joint Manipulations    PT Next Visit Plan instruct in lymph rrisk reduction and  infection as pt has a work conflict, check left mastectomy incision, PROM with caution on left (use scap. depression) and keep in pain free ROM, STW, MFR to pecs/axillary borders, progress to standing retraction with band etc as  tolerated to strengthen postural muscles if can do without shoulder pain on Left.    PT Home Exercise Plan Cont gentle stretching/ A/ROM of bil shoulders being mindful of not increasing pain on Lt    Consulted and Agree with Plan of Care Patient           Patient will benefit from skilled therapeutic intervention in order to improve the following deficits and impairments:  Decreased skin integrity,Increased fascial restricitons,Impaired sensation,Decreased scar mobility,Postural dysfunction,Decreased activity tolerance,Decreased range of motion,Impaired UE functional use,Decreased knowledge of precautions,Increased edema,Impaired flexibility  Visit Diagnosis: Aftercare following surgery for neoplasm  Abnormal posture  Chronic left shoulder pain  Intraductal carcinoma in situ of right breast     Problem List Patient Active Problem List   Diagnosis Date Noted  . Pain in left shoulder 10/06/2020  . Rash 08/12/2020  . Genetic testing 07/26/2020  . Ductal carcinoma in situ (DCIS) of right breast 07/05/2020  . Family history of breast cancer   . Family history of prostate cancer   . Lumbar paraspinal muscle spasm 10/16/2018  . Chronic insomnia 05/31/2015  . Hypothyroidism following radioiodine therapy 05/15/2011  . HYPERTHYROIDISM 11/03/2009  . MICROSCOPIC HEMATURIA 11/01/2009  . THYROID FUNCTION TEST, ABNORMAL 11/01/2009  . COLONIC POLYPS 10/08/2009  . EDEMA 10/08/2009  . NEOPLASM, MALIGNANT, BREAST, HX OF 10/08/2009    Claris Pong 10/07/2020, 5:29 PM  [place], [place], [address] Phone: [phone]   Fax:  [phone]  Name: NADRA HRITZ MRN: [id number] Date  of Birth: January 25, 1961 Cheral Almas, PT 10/07/20 5:30 PM

## 2020-10-12 ENCOUNTER — Ambulatory Visit: Payer: No Typology Code available for payment source

## 2020-10-14 ENCOUNTER — Other Ambulatory Visit: Payer: Self-pay

## 2020-10-14 ENCOUNTER — Ambulatory Visit: Payer: No Typology Code available for payment source

## 2020-10-14 DIAGNOSIS — R293 Abnormal posture: Secondary | ICD-10-CM

## 2020-10-14 DIAGNOSIS — Z483 Aftercare following surgery for neoplasm: Secondary | ICD-10-CM | POA: Diagnosis not present

## 2020-10-14 DIAGNOSIS — M25512 Pain in left shoulder: Secondary | ICD-10-CM

## 2020-10-14 DIAGNOSIS — G8929 Other chronic pain: Secondary | ICD-10-CM

## 2020-10-14 DIAGNOSIS — D0511 Intraductal carcinoma in situ of right breast: Secondary | ICD-10-CM

## 2020-10-14 NOTE — Therapy (Signed)
[place], [place], [address] Phone: [phone]   Fax:  [phone]  Physical Therapy Treatment  Patient Details  Name: Alicia Mcconnell MRN: [id number] Date of Birth: Dec 23, 1960 Referring Provider (PT): Dr. Brantley Stage   Encounter Date: 10/14/2020   PT End of Session - 10/14/20 1358    Visit Number 5    Number of Visits 12    Date for PT Re-Evaluation 11/08/20    PT Start Time 9381    PT Stop Time 1355    PT Time Calculation (min) 50 min    Activity Tolerance Patient tolerated treatment well    Behavior During Therapy [facility] for tasks assessed/performed           Past Medical History:  Diagnosis Date  . ALLERGIC RHINITIS 10/08/2009  . Arthritis    psoriatic arthritis-on Enbrel and Methotrexate  . 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 Cancer ([place]) 1996   infiltrating ductal, lumpectomy with axillary node dissection.  ER posivtive     . COLONIC POLYPS 10/08/2009  . Edema 10/08/2009  . Family history of breast cancer   . Family history of prostate cancer   . Graves disease   . HYPERTHYROIDISM 11/03/2009  . Hypothyroidism   . Microscopic hematuria 11/01/2009  . NEOPLASM, MALIGNANT, BREAST, HX OF 10/08/2009  . THYROID FUNCTION TEST, ABNORMAL 11/01/2009    Past Surgical History:  Procedure Laterality Date  . BREAST SURGERY     breast CA, 6 weeks radiation  . CHOLECYSTECTOMY    . labrum tear Right 2/12  . SIMPLE MASTECTOMY WITH AXILLARY SENTINEL NODE BIOPSY Bilateral 07/29/2020   Procedure: BILATERAL SIMPLE MASTECTOMY;  Surgeon: Erroll Luna, MD;  Location: [place];  Service: General;  Laterality: Bilateral;  PECTORAL BLOCK, RNFA    There were no vitals filed for this visit.   Subjective Assessment - 10/14/20 1303    Subjective I missed Tuesday because my dog had to have surgery.  Left shoulder has been a little better since injection but I have been babying it.  I  got the yellowish stuff off my left chest and it looks better.    Pertinent History Prior hx of right Breast Cancer in 1996 with ALND of 28 LN's with prior radiation.  Most recent surgery on 07/29/2020 for Bilateral simple mastectomies with Right ductal Carcinoma in Situr ER+,PR+.  Pt has had bilateral shoulder surgeries and is presently having alot of left shoulder joint pain which was to have been evaluated by [name] but held secondary to her new diagnosis. Believes she has a RTC tear.    Currently in Pain? Yes    Pain Location Shoulder    Pain Orientation Left    Pain Type Chronic pain    Pain Onset More than a month ago    Multiple Pain Sites No                             [facility] Adult PT Treatment/Exercise - 10/14/20 0001      Manual Therapy   Myofascial Release In Supine to Rt axilla during P/ROM, also to pect insertion; cording noted bilateral axilla and MFR performed here as well.  Gentle MFR right and left chest    Manual Lymphatic Drainage (MLD) short neck, 5 breaths, right inguinal LN, Right axilo inguinal pathway in supine and SL to decrease sweling at right lateral trunk  Passive ROM In Supine: Rt shoulder into flexion, abduction and D2 to pts available end motions; then to Lt shoulder slowly and gently into flexion and scaption, and ER pt able to relax well and could do with min. complaints if she depressed scapula.                       PT Long Term Goals - 09/27/20 1756      PT LONG TERM GOAL #1   Title pt will be independent in HEP for bilateral shoulder ROM    Time 4    Period Weeks    Status New    Target Date 10/25/20      PT LONG TERM GOAL #2   Title Quick dash will be no greater than 20%    Baseline 41%    Time 6    Period Weeks    Status New    Target Date 11/08/20      PT LONG TERM GOAL #3   Title Pts AROM shoulders flexion and abd will increase to atleast 155 degrees for improved reaching ability    Time 4    Period  Weeks    Status New    Target Date 11/08/20      PT LONG TERM GOAL #4   Title Pt will have decreased complaints of tightness by atleast 50%    Time 6    Period Weeks    Status New    Target Date 11/08/20      PT LONG TERM GOAL #5   Title pt will be fit for appropriate compression garments (bra/sleeve) prn    Time 6    Period Weeks    Status New    Target Date 11/08/20                 Plan - 10/14/20 1358    Clinical Impression Statement therapy consisted of extensive MFR techniques to bilateral UE areas of cording running down to mastectomy incision, PROM and MLD to right lateral trunk.  Cording significantly better after rx and pt felt much looser.  She was able to relax Left UE well for PROM with occasional VC's    Personal Factors and Comorbidities Comorbidity 3+    Comorbidities prior Breast CA with radiation, psoriatic arthritis, Left shoulder Pain (potential RTC tear)    Stability/Clinical Decision Making Stable/Uncomplicated    Rehab Potential Good    PT Frequency 2x / week    PT Duration 6 weeks    PT Treatment/Interventions ADLs/Self Care Home Management;Therapeutic exercise;Patient/family education;Manual techniques;Manual lymph drainage;Scar mobilization;Joint Manipulations    PT Next Visit Plan instruct in lymph rrisk reduction and infection as pt has a work conflict,  PROM with caution on left (use scap. depression) and keep in pain free ROM, STW, MFR to pecs/axillary borders, progress to standing retraction with band etc as tolerated to strengthen postural muscles if can do without shoulder pain on Left.    PT Home Exercise Plan Cont gentle stretching/ A/ROM of bil shoulders being mindful of not increasing pain on Lt    Recommended Other Services to be measured for sleeve/bra on April 1 with Melissa    Consulted and Agree with Plan of Care Patient           Patient will benefit from skilled therapeutic intervention in order to improve the following deficits  and impairments:  Decreased skin integrity,Increased fascial restricitons,Impaired sensation,Decreased scar mobility,Postural dysfunction,Decreased activity tolerance,Decreased range of motion,Impaired  UE functional use,Decreased knowledge of precautions,Increased edema,Impaired flexibility  Visit Diagnosis: Aftercare following surgery for neoplasm  Abnormal posture  Chronic left shoulder pain  Intraductal carcinoma in situ of right breast     Problem List Patient Active Problem List   Diagnosis Date Noted  . Pain in left shoulder 10/06/2020  . Rash 08/12/2020  . Genetic testing 07/26/2020  . Ductal carcinoma in situ (DCIS) of right breast 07/05/2020  . Family history of breast cancer   . Family history of prostate cancer   . Lumbar paraspinal muscle spasm 10/16/2018  . Chronic insomnia 05/31/2015  . Hypothyroidism following radioiodine therapy 05/15/2011  . HYPERTHYROIDISM 11/03/2009  . MICROSCOPIC HEMATURIA 11/01/2009  . THYROID FUNCTION TEST, ABNORMAL 11/01/2009  . COLONIC POLYPS 10/08/2009  . EDEMA 10/08/2009  . NEOPLASM, MALIGNANT, BREAST, HX OF 10/08/2009    Claris Pong 10/14/2020, 2:03 PM  [place], [place], [address] Phone: [phone]   Fax:  [phone]  Name: Alicia Mcconnell MRN: [id number] Date of Birth: 1961-05-18 Cheral Almas, PT 10/14/20 2:04 PM

## 2020-10-19 ENCOUNTER — Ambulatory Visit: Payer: No Typology Code available for payment source

## 2020-10-19 ENCOUNTER — Other Ambulatory Visit: Payer: Self-pay

## 2020-10-19 DIAGNOSIS — G8929 Other chronic pain: Secondary | ICD-10-CM

## 2020-10-19 DIAGNOSIS — R293 Abnormal posture: Secondary | ICD-10-CM

## 2020-10-19 DIAGNOSIS — D0511 Intraductal carcinoma in situ of right breast: Secondary | ICD-10-CM

## 2020-10-19 DIAGNOSIS — Z483 Aftercare following surgery for neoplasm: Secondary | ICD-10-CM

## 2020-10-19 NOTE — Therapy (Signed)
[place], [place], [address] Phone: [phone]   Fax:  [phone]  Physical Therapy Treatment  Patient Details  Name: Alicia Mcconnell MRN: [id number] Date of Birth: 1961/01/16 Referring Provider (PT): Dr. Brantley Stage   Encounter Date: 10/19/2020   PT End of Session - 10/19/20 1511    Visit Number 6    Number of Visits 12    Date for PT Re-Evaluation 11/08/20    PT Start Time 1505    PT Stop Time 1552    PT Time Calculation (min) 47 min    Activity Tolerance Patient tolerated treatment well    Behavior During Therapy [facility] for tasks assessed/performed           Past Medical History:  Diagnosis Date  . ALLERGIC RHINITIS 10/08/2009  . Arthritis    psoriatic arthritis-on Enbrel and Methotrexate  . 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 Cancer ([place]) 1996   infiltrating ductal, lumpectomy with axillary node dissection.  ER posivtive     . COLONIC POLYPS 10/08/2009  . Edema 10/08/2009  . Family history of breast cancer   . Family history of prostate cancer   . Graves disease   . HYPERTHYROIDISM 11/03/2009  . Hypothyroidism   . Microscopic hematuria 11/01/2009  . NEOPLASM, MALIGNANT, BREAST, HX OF 10/08/2009  . THYROID FUNCTION TEST, ABNORMAL 11/01/2009    Past Surgical History:  Procedure Laterality Date  . BREAST SURGERY     breast CA, 6 weeks radiation  . CHOLECYSTECTOMY    . labrum tear Right 2/12  . SIMPLE MASTECTOMY WITH AXILLARY SENTINEL NODE BIOPSY Bilateral 07/29/2020   Procedure: BILATERAL SIMPLE MASTECTOMY;  Surgeon: Erroll Luna, MD;  Location: [facility];  Service: General;  Laterality: Bilateral;  PECTORAL BLOCK, RNFA    There were no vitals filed for this visit.   Subjective Assessment - 10/19/20 1501    Subjective Left shoulder is doing a little better after the PROM. A little less tightness in axilla too.  Right shoulder seems to be doing OK to.     Pertinent History Prior hx of right Breast Cancer in 1996 with ALND of 28 LN's with prior radiation.  Most recent surgery on 07/29/2020 for Bilateral simple mastectomies with Right ductal Carcinoma in Situr ER+,PR+.  Pt has had bilateral shoulder surgeries and is presently having alot of left shoulder joint pain which was to have been evaluated by [name] but held secondary to her new diagnosis. Believes she has a RTC tear.    Patient Stated Goals Normal ROM, less pain/restriction.    Currently in Pain? No/denies                             [facility] Adult PT Treatment/Exercise - 10/19/20 0001      Shoulder Exercises: Supine   Other Supine Exercises AROM left shoulder 75-90 x 10      Shoulder Exercises: Standing   External Rotation Strengthening;Both;10 reps    Theraband Level (Shoulder External Rotation) Level 1 (Yellow)    Extension Strengthening;Both;10 reps    Theraband Level (Shoulder Extension) Level 1 (Yellow)    Retraction Strengthening;Both;10 reps    Theraband Level (Shoulder Retraction) Level 1 (Yellow)      Manual Therapy   Soft tissue mobilization To bilateral pecs/axillary border with cocobutter    Myofascial Release In Supine to Rt axilla during P/ROM, also to pect  insertion; cording noted bilateral axilla and MFR performed here as well.  Gentle MFR right and left chest    Passive ROM In Supine: Rt shoulder into flexion, abduction, ER and D2 to pts available end motions; then to Lt shoulder slowly and gently into flexion and scaption, and ER pt able to relax well and could do without complaint.                       PT Long Term Goals - 09/27/20 1756      PT LONG TERM GOAL #1   Title pt will be independent in HEP for bilateral shoulder ROM    Time 4    Period Weeks    Status New    Target Date 10/25/20      PT LONG TERM GOAL #2   Title Quick dash will be no greater than 20%    Baseline 41%    Time 6    Period Weeks    Status New     Target Date 11/08/20      PT LONG TERM GOAL #3   Title Pts AROM shoulders flexion and abd will increase to atleast 155 degrees for improved reaching ability    Time 4    Period Weeks    Status New    Target Date 11/08/20      PT LONG TERM GOAL #4   Title Pt will have decreased complaints of tightness by atleast 50%    Time 6    Period Weeks    Status New    Target Date 11/08/20      PT LONG TERM GOAL #5   Title pt will be fit for appropriate compression garments (bra/sleeve) prn    Time 6    Period Weeks    Status New    Target Date 11/08/20                 Plan - 10/19/20 1545    Clinical Impression Statement Pt noted to have a light rash under right mastectomy incision which is itchy.  If unimproved she will see her dermatologist.  Pt had good tolerance for supine AROM left shoulder 70-90 degrees x 10.  Pt also did very well with standing theraband postural  exs and was able to perform all x 10 with yellow band without increase in pain. Mildly increased tissue tension noted bilateral pectorals and axillary border.  Good tolerance for bilateral PROM today    Personal Factors and Comorbidities Comorbidity 3+    Comorbidities prior Breast CA with radiation, psoriatic arthritis, Left shoulder Pain (potential RTC tear)    Stability/Clinical Decision Making Stable/Uncomplicated    Rehab Potential Good    PT Frequency 2x / week    PT Duration 6 weeks    PT Treatment/Interventions ADLs/Self Care Home Management;Therapeutic exercise;Patient/family education;Manual techniques;Manual lymph drainage;Scar mobilization;Joint Manipulations    PT Next Visit Plan instruct in lymph rrisk reduction and infection as pt has a work conflict,  PROM with caution on left (use scap. depression) and keep in pain free ROM, STW, MFR to pecs/axillary borders, Review standing retraction, ext, ER with band etc as tolerated to strengthen postural muscles if can do without shoulder pain on Left.    PT  Home Exercise Plan Cont gentle stretching/ A/ROM of bil shoulders being mindful of not increasing pain on Lt, standing theraband scap. retraction, extension, bilateral ER with yellow x 10 (3 days per week)    Consulted  and Agree with Plan of Care Patient           Patient will benefit from skilled therapeutic intervention in order to improve the following deficits and impairments:  Decreased skin integrity,Increased fascial restricitons,Impaired sensation,Decreased scar mobility,Postural dysfunction,Decreased activity tolerance,Decreased range of motion,Impaired UE functional use,Decreased knowledge of precautions,Increased edema,Impaired flexibility  Visit Diagnosis: Aftercare following surgery for neoplasm  Abnormal posture  Chronic left shoulder pain  Intraductal carcinoma in situ of right breast     Problem List Patient Active Problem List   Diagnosis Date Noted  . Pain in left shoulder 10/06/2020  . Rash 08/12/2020  . Genetic testing 07/26/2020  . Ductal carcinoma in situ (DCIS) of right breast 07/05/2020  . Family history of breast cancer   . Family history of prostate cancer   . Lumbar paraspinal muscle spasm 10/16/2018  . Chronic insomnia 05/31/2015  . Hypothyroidism following radioiodine therapy 05/15/2011  . HYPERTHYROIDISM 11/03/2009  . MICROSCOPIC HEMATURIA 11/01/2009  . THYROID FUNCTION TEST, ABNORMAL 11/01/2009  . COLONIC POLYPS 10/08/2009  . EDEMA 10/08/2009  . NEOPLASM, MALIGNANT, BREAST, HX OF 10/08/2009    Claris Pong 10/19/2020, 5:14 PM  [place], [place], [address] Phone: [phone]   Fax:  [phone]  Name: DREMA EDDINGTON MRN: [id number] Date of Birth: August 05, 1960  Cheral Almas, PT 10/19/20 5:16 PM

## 2020-10-19 NOTE — Patient Instructions (Signed)
Access Code: [email or web address] URL: [email or web address] Date: 10/19/2020 Prepared by: Cheral Almas  Exercises Scapular Retraction with Resistance - 1 x daily - 3 x weekly - 1 sets - 10 reps Scapular Retraction with Resistance Advanced - 1 x daily - 3 x weekly - 1 sets - 10 reps Shoulder External Rotation and Scapular Retraction with Resistance - 1 x daily - 3 x weekly - 1 sets - 10 reps

## 2020-10-22 ENCOUNTER — Other Ambulatory Visit: Payer: Self-pay

## 2020-10-22 ENCOUNTER — Ambulatory Visit: Payer: No Typology Code available for payment source | Attending: Surgery

## 2020-10-22 DIAGNOSIS — M25512 Pain in left shoulder: Secondary | ICD-10-CM | POA: Insufficient documentation

## 2020-10-22 DIAGNOSIS — R293 Abnormal posture: Secondary | ICD-10-CM | POA: Diagnosis present

## 2020-10-22 DIAGNOSIS — D0511 Intraductal carcinoma in situ of right breast: Secondary | ICD-10-CM | POA: Diagnosis present

## 2020-10-22 DIAGNOSIS — Z483 Aftercare following surgery for neoplasm: Secondary | ICD-10-CM | POA: Insufficient documentation

## 2020-10-22 DIAGNOSIS — G8929 Other chronic pain: Secondary | ICD-10-CM | POA: Diagnosis present

## 2020-10-22 NOTE — Therapy (Signed)
[place], [place], [address] Phone: [phone]   Fax:  [phone]  Physical Therapy Treatment  Patient Details  Name: Alicia Mcconnell MRN: [id number] Date of Birth: 11-08-1960 Referring Provider (PT): Dr. Brantley Stage   Encounter Date: 10/22/2020   PT End of Session - 10/22/20 1122    Visit Number 7    Number of Visits 12    Date for PT Re-Evaluation 11/08/20    PT Start Time 6962    PT Stop Time 1055    PT Time Calculation (min) 53 min    Activity Tolerance Patient tolerated treatment well    Behavior During Therapy [facility] for tasks assessed/performed           Past Medical History:  Diagnosis Date  . ALLERGIC RHINITIS 10/08/2009  . Arthritis    psoriatic arthritis-on Enbrel and Methotrexate  . 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 Cancer ([place]) 1996   infiltrating ductal, lumpectomy with axillary node dissection.  ER posivtive     . COLONIC POLYPS 10/08/2009  . Edema 10/08/2009  . Family history of breast cancer   . Family history of prostate cancer   . Graves disease   . HYPERTHYROIDISM 11/03/2009  . Hypothyroidism   . Microscopic hematuria 11/01/2009  . NEOPLASM, MALIGNANT, BREAST, HX OF 10/08/2009  . THYROID FUNCTION TEST, ABNORMAL 11/01/2009    Past Surgical History:  Procedure Laterality Date  . BREAST SURGERY     breast CA, 6 weeks radiation  . CHOLECYSTECTOMY    . labrum tear Right 2/12  . SIMPLE MASTECTOMY WITH AXILLARY SENTINEL NODE BIOPSY Bilateral 07/29/2020   Procedure: BILATERAL SIMPLE MASTECTOMY;  Surgeon: Erroll Luna, MD;  Location: [facility];  Service: General;  Laterality: Bilateral;  PECTORAL BLOCK, RNFA    There were no vitals filed for this visit.   Subjective Assessment - 10/22/20 1007    Subjective Cording seems to be better on both sides. Left shoulder seems to be doing better overall as well.  My muscles were a little sore from the  theraband exs last time. I go back to work on April 11.    Pertinent History Prior hx of right Breast Cancer in 1996 with ALND of 28 LN's with prior radiation.  Most recent surgery on 07/29/2020 for Bilateral simple mastectomies with Right ductal Carcinoma in Situr ER+,PR+.  Pt has had bilateral shoulder surgeries and is presently having alot of left shoulder joint pain which was to have been evaluated by [name] but held secondary to her new diagnosis. Believes she has a RTC tear.    Patient Stated Goals Normal ROM, less pain/restriction.    Currently in Pain? No/denies                             [facility] Adult PT Treatment/Exercise - 10/22/20 0001      Manual Therapy   Manual therapy comments chip pack mad for left chest area of swelling inferior to incision.    Soft tissue mobilization To right pecs/axillary border with cocobutter, gentle masectomy scar mobilization bilaterally    Myofascial Release In Supine to Rt axilla during P/ROM, also to pect insertion; cording noted bilateral axilla and MFR performed here as well.  Gentle MFR right and left chest    Passive ROM In Supine: Rt shoulder into flexion, abduction and D2 to pts available end motions; then to Lt  shoulder slowly and gently into flexion and scaption, and ER pt able to relax well and could do with min. complaints if she depressed scapula.                       PT Long Term Goals - 09/27/20 1756      PT LONG TERM GOAL #1   Title pt will be independent in HEP for bilateral shoulder ROM    Time 4    Period Weeks    Status New    Target Date 10/25/20      PT LONG TERM GOAL #2   Title Quick dash will be no greater than 20%    Baseline 41%    Time 6    Period Weeks    Status New    Target Date 11/08/20      PT LONG TERM GOAL #3   Title Pts AROM shoulders flexion and abd will increase to atleast 155 degrees for improved reaching ability    Time 4    Period Weeks    Status New    Target  Date 11/08/20      PT LONG TERM GOAL #4   Title Pt will have decreased complaints of tightness by atleast 50%    Time 6    Period Weeks    Status New    Target Date 11/08/20      PT LONG TERM GOAL #5   Title pt will be fit for appropriate compression garments (bra/sleeve) prn    Time 6    Period Weeks    Status New    Target Date 11/08/20                 Plan - 10/22/20 1123    Clinical Impression Statement Light rash still present at right chest region but improved.  Pts cording and bilateral shoulder ROM is improved overall but cording is still evident but not as restricting.  bilateral mastectomy incisions adhered and did some MFR and gentle scar mobilization bilaterally.   Chip pack was made for left trunk area to see if it would reduce swelling there.  She was fit for compression sleeve/bra today    Personal Factors and Comorbidities Comorbidity 3+    Comorbidities prior Breast CA with radiation, psoriatic arthritis, Left shoulder Pain (potential RTC tear)    Stability/Clinical Decision Making Stable/Uncomplicated    Rehab Potential Good    PT Frequency 2x / week    PT Duration 8 weeks    PT Treatment/Interventions ADLs/Self Care Home Management;Therapeutic exercise;Patient/family education;Manual techniques;Manual lymph drainage;Scar mobilization;Joint Manipulations    PT Next Visit Plan instruct in lymph risk reduction and infection as pt has a work conflict,  PROM with caution on left (use scap. depression) and keep in pain free ROM, STW, MFR to pecs/axillary borders, scar mobilization Review standing retraction, ext, ER with band etc as tolerated to strengthen postural muscles if can do without shoulder pain on Left. See if chip pack helped.  ?MLD    PT Home Exercise Plan Cont gentle stretching/ A/ROM of bil shoulders being mindful of not increasing pain on Lt, standing theraband scap. retraction, extension, bilateral ER with yellow x 10 (3 days per week)    Consulted and  Agree with Plan of Care Patient           Patient will benefit from skilled therapeutic intervention in order to improve the following deficits and impairments:  Decreased skin integrity,Increased fascial  restricitons,Impaired sensation,Decreased scar mobility,Postural dysfunction,Decreased activity tolerance,Decreased range of motion,Impaired UE functional use,Decreased knowledge of precautions,Increased edema,Impaired flexibility  Visit Diagnosis: Aftercare following surgery for neoplasm  Abnormal posture  Chronic left shoulder pain  Intraductal carcinoma in situ of right breast     Problem List Patient Active Problem List   Diagnosis Date Noted  . Pain in left shoulder 10/06/2020  . Rash 08/12/2020  . Genetic testing 07/26/2020  . Ductal carcinoma in situ (DCIS) of right breast 07/05/2020  . Family history of breast cancer   . Family history of prostate cancer   . Lumbar paraspinal muscle spasm 10/16/2018  . Chronic insomnia 05/31/2015  . Hypothyroidism following radioiodine therapy 05/15/2011  . HYPERTHYROIDISM 11/03/2009  . MICROSCOPIC HEMATURIA 11/01/2009  . THYROID FUNCTION TEST, ABNORMAL 11/01/2009  . COLONIC POLYPS 10/08/2009  . EDEMA 10/08/2009  . NEOPLASM, MALIGNANT, BREAST, HX OF 10/08/2009    [facility] 10/22/2020, 11:30 AM  [place], [place], [address] Phone: [phone]   Fax:  [phone]  Name: PATTIANN SOLANKI MRN: [id number] Date of Birth: 1961-06-21 Cheral Almas, PT 10/22/20 11:37 AM

## 2020-10-25 ENCOUNTER — Ambulatory Visit: Payer: No Typology Code available for payment source

## 2020-10-25 ENCOUNTER — Other Ambulatory Visit: Payer: Self-pay

## 2020-10-25 DIAGNOSIS — Z483 Aftercare following surgery for neoplasm: Secondary | ICD-10-CM | POA: Diagnosis not present

## 2020-10-25 DIAGNOSIS — R293 Abnormal posture: Secondary | ICD-10-CM

## 2020-10-25 DIAGNOSIS — G8929 Other chronic pain: Secondary | ICD-10-CM

## 2020-10-25 DIAGNOSIS — M25512 Pain in left shoulder: Secondary | ICD-10-CM

## 2020-10-25 DIAGNOSIS — D0511 Intraductal carcinoma in situ of right breast: Secondary | ICD-10-CM

## 2020-10-25 NOTE — Therapy (Signed)
[place], [place], [address] Phone: [phone]   Fax:  [phone]  Physical Therapy Treatment  Patient Details  Name: Alicia Mcconnell MRN: [id number] Date of Birth: 1960/08/13 Referring Provider (PT): Dr. Brantley Stage   Encounter Date: 10/25/2020   PT End of Session - 10/25/20 1056    Visit Number 8    Number of Visits 12    Date for PT Re-Evaluation 11/08/20    PT Start Time 1003    PT Stop Time 1052    PT Time Calculation (min) 49 min    Activity Tolerance Patient tolerated treatment well    Behavior During Therapy [facility] for tasks assessed/performed           Past Medical History:  Diagnosis Date  . ALLERGIC RHINITIS 10/08/2009  . Arthritis    psoriatic arthritis-on Enbrel and Methotrexate  . 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 Cancer ([place]) 1996   infiltrating ductal, lumpectomy with axillary node dissection.  ER posivtive     . COLONIC POLYPS 10/08/2009  . Edema 10/08/2009  . Family history of breast cancer   . Family history of prostate cancer   . Graves disease   . HYPERTHYROIDISM 11/03/2009  . Hypothyroidism   . Microscopic hematuria 11/01/2009  . NEOPLASM, MALIGNANT, BREAST, HX OF 10/08/2009  . THYROID FUNCTION TEST, ABNORMAL 11/01/2009    Past Surgical History:  Procedure Laterality Date  . BREAST SURGERY     breast CA, 6 weeks radiation  . CHOLECYSTECTOMY    . labrum tear Right 2/12  . SIMPLE MASTECTOMY WITH AXILLARY SENTINEL NODE BIOPSY Bilateral 07/29/2020   Procedure: BILATERAL SIMPLE MASTECTOMY;  Surgeon: Erroll Luna, MD;  Location: [facility];  Service: General;  Laterality: Bilateral;  PECTORAL BLOCK, RNFA    There were no vitals filed for this visit.   Subjective Assessment - 10/25/20 1004    Subjective Measuring went fine on Friday.  I need to pick out the design on my sleeve. Did not use chip pack over the weekend but have in today.    Pertinent History Prior hx of right Breast Cancer in 1996 with ALND of 28 LN's with prior radiation.  Most recent surgery on 07/29/2020 for Bilateral simple mastectomies with Right ductal Carcinoma in Situr ER+,PR+.  Pt has had bilateral shoulder surgeries and is presently having alot of left shoulder joint pain which was to have been evaluated by [name] but held secondary to her new diagnosis. Believes she has a RTC tear.              [facility] PT Assessment - 10/25/20 0001      AROM   Right Shoulder Extension 54 Degrees    Right Shoulder Flexion 164 Degrees    Right Shoulder ABduction 175 Degrees    Left Shoulder Extension 46 Degrees    Left Shoulder Flexion 156 Degrees    Left Shoulder ABduction 100 Degrees   limited by pain                        [facility] Adult PT Treatment/Exercise - 10/25/20 0001      Manual Therapy   Soft tissue mobilization To left pecs/axillary border with cocobutter, gentle masectomy scar mobilization bilaterally    Myofascial Release In Supine to Rt axilla during P/ROM, also to pect insertion; cording noted bilateral axilla and MFR performed here as well.  Gentle MFR right  and left chest    Passive ROM In Supine: Rt shoulder into flexion, abduction and D2 to pts available end motions; then to Lt shoulder slowly and gently into flexion and scaption, and ER pt able to relax well and could do with min. complaints if she depressed scapula.                       PT Long Term Goals - 10/25/20 1102      PT LONG TERM GOAL #1   Title pt will be independent in HEP for bilateral shoulder ROM    Time 4    Period Weeks    Status Achieved      PT LONG TERM GOAL #2   Title Quick dash will be no greater than 20%    Baseline 41%    Time 6    Period Weeks    Status New      PT LONG TERM GOAL #3   Title Pts AROM shoulders flexion and abd will increase to atleast 155 degrees for improved reaching ability    Baseline achieved for right and  left flexion and right abd.  Left abd limited by pain    Time 4    Period Weeks    Status Partially Met      PT LONG TERM GOAL #4   Title Pt will have decreased complaints of tightness by atleast 50%    Time 6    Period Weeks    Status On-going      PT LONG TERM GOAL #5   Title pt will be fit for appropriate compression garments (bra/sleeve) prn    Baseline fit 4/1 but not received yet    Time 6    Period Weeks    Status On-going                 Plan - 10/25/20 1056    Clinical Impression Statement Light rash still present at right chest so avoided work to that area.  Cording still evident but not very restricting.  AROM was assessed and improved greatly on the right and for left shoulder flexion.  Left shoulder abduction less secondary to pain and pt advised not to force.  Pt has not really tried the chip pack yet but will do so today.  bilateral mastectomy incisions still with decreased mobility. She is very distraught and having to make arrangements for a sick friend. She will have to return to work on April 11 and may need to decrease visits then.    Personal Factors and Comorbidities Comorbidity 3+    Comorbidities prior Breast CA with radiation, psoriatic arthritis, Left shoulder Pain (potential RTC tear)    Stability/Clinical Decision Making Stable/Uncomplicated    Rehab Potential Good    PT Frequency 2x / week    PT Duration 8 weeks    PT Treatment/Interventions ADLs/Self Care Home Management;Therapeutic exercise;Patient/family education;Manual techniques;Manual lymph drainage;Scar mobilization;Joint Manipulations    PT Next Visit Plan instruct in lymph risk reduction and infection as pt has a work conflict,  PROM with caution on left (use scap. depression) and keep in pain free ROM, STW, MFR to pecs/axillary borders, scar mobilization Review standing retraction, ext, ER with band etc as tolerated to strengthen postural muscles if can do without shoulder pain on Left. See  if chip pack helped.  ?MLD. assess sleeve and bra when she receives    PT Home Exercise Plan Cont gentle stretching/ A/ROM of bil  shoulders being mindful of not increasing pain on Lt, standing theraband scap. retraction, extension, bilateral ER with yellow x 10 (3 days per week) chip pack    Consulted and Agree with Plan of Care Patient           Patient will benefit from skilled therapeutic intervention in order to improve the following deficits and impairments:  Decreased skin integrity,Increased fascial restricitons,Impaired sensation,Decreased scar mobility,Postural dysfunction,Decreased activity tolerance,Decreased range of motion,Impaired UE functional use,Decreased knowledge of precautions,Increased edema,Impaired flexibility  Visit Diagnosis: Aftercare following surgery for neoplasm  Abnormal posture  Chronic left shoulder pain  Intraductal carcinoma in situ of right breast     Problem List Patient Active Problem List   Diagnosis Date Noted  . Pain in left shoulder 10/06/2020  . Rash 08/12/2020  . Genetic testing 07/26/2020  . Ductal carcinoma in situ (DCIS) of right breast 07/05/2020  . Family history of breast cancer   . Family history of prostate cancer   . Lumbar paraspinal muscle spasm 10/16/2018  . Chronic insomnia 05/31/2015  . Hypothyroidism following radioiodine therapy 05/15/2011  . HYPERTHYROIDISM 11/03/2009  . MICROSCOPIC HEMATURIA 11/01/2009  . THYROID FUNCTION TEST, ABNORMAL 11/01/2009  . COLONIC POLYPS 10/08/2009  . EDEMA 10/08/2009  . NEOPLASM, MALIGNANT, BREAST, HX OF 10/08/2009    [facility] 10/25/2020, 11:04 AM  [place], [place], [address] Phone: [phone]   Fax:  [phone]  Name: Alicia Mcconnell MRN: [id number] Date of Birth: Sep 02, 1960 Cheral Almas, PT 10/25/20 11:06 AM

## 2020-10-26 ENCOUNTER — Encounter: Payer: Self-pay | Admitting: General Practice

## 2020-10-26 NOTE — Progress Notes (Signed)
[place] Spiritual Care Note  Referred by breast navigator [facility] Martini/RN for additional layer of emotional support as Ms Archibeque adjusts to diagnosis and bilateral mastectomy. 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 Scheduled appointment with Ms Patchen in my office tomorrow at 2:30 to process both concerns.   [place], [place], [facility] Pager [phone] Voicemail [phone]

## 2020-10-27 ENCOUNTER — Encounter: Payer: Self-pay | Admitting: General Practice

## 2020-10-27 NOTE — Progress Notes (Signed)
[place] Note  Alicia Mcconnell called to postpone our meeting due to exhaustion from comfort-care meeting today for her ex-husband, who will be receiving a hospice referral. She plans to call again to reschedule and is aware that we can do a phone or Webex meeting, too, if that's more convenient than in-person.   [place], [place], [facility] Pager [phone] Voicemail [phone]

## 2020-11-03 ENCOUNTER — Ambulatory Visit: Payer: No Typology Code available for payment source

## 2020-12-14 ENCOUNTER — Ambulatory Visit: Payer: No Typology Code available for payment source

## 2020-12-28 ENCOUNTER — Ambulatory Visit: Payer: No Typology Code available for payment source | Attending: Surgery

## 2020-12-28 ENCOUNTER — Other Ambulatory Visit: Payer: Self-pay

## 2020-12-28 DIAGNOSIS — D0511 Intraductal carcinoma in situ of right breast: Secondary | ICD-10-CM | POA: Diagnosis present

## 2020-12-28 DIAGNOSIS — Z483 Aftercare following surgery for neoplasm: Secondary | ICD-10-CM | POA: Diagnosis not present

## 2020-12-28 DIAGNOSIS — R293 Abnormal posture: Secondary | ICD-10-CM | POA: Diagnosis present

## 2020-12-28 DIAGNOSIS — M25512 Pain in left shoulder: Secondary | ICD-10-CM | POA: Insufficient documentation

## 2020-12-28 DIAGNOSIS — G8929 Other chronic pain: Secondary | ICD-10-CM | POA: Diagnosis present

## 2020-12-28 NOTE — Therapy (Signed)
[place], [place], [address] Phone: [phone]   Fax:  [phone]  Physical Therapy Treatment  Patient Details  Name: Alicia Mcconnell MRN: [id number] Date of Birth: 09-Nov-1960 Referring Provider (PT): Dr. Brantley Stage   Encounter Date: 12/28/2020   PT End of Session - 12/28/20 1457    Visit Number 9    Number of Visits 12    Date for PT Re-Evaluation 12/27/20    PT Start Time 5009    PT Stop Time 1450    PT Time Calculation (min) 45 min    Activity Tolerance Patient tolerated treatment well    Behavior During Therapy [facility] for tasks assessed/performed           Past Medical History:  Diagnosis Date  . ALLERGIC RHINITIS 10/08/2009  . Arthritis    psoriatic arthritis-on Enbrel and Methotrexate  . 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 Cancer ([place]) 1996   infiltrating ductal, lumpectomy with axillary node dissection.  ER posivtive     . COLONIC POLYPS 10/08/2009  . Edema 10/08/2009  . Family history of breast cancer   . Family history of prostate cancer   . Graves disease   . HYPERTHYROIDISM 11/03/2009  . Hypothyroidism   . Microscopic hematuria 11/01/2009  . NEOPLASM, MALIGNANT, BREAST, HX OF 10/08/2009  . THYROID FUNCTION TEST, ABNORMAL 11/01/2009    Past Surgical History:  Procedure Laterality Date  . BREAST SURGERY     breast CA, 6 weeks radiation  . CHOLECYSTECTOMY    . labrum tear Right 2/12  . SIMPLE MASTECTOMY WITH AXILLARY SENTINEL NODE BIOPSY Bilateral 07/29/2020   Procedure: BILATERAL SIMPLE MASTECTOMY;  Surgeon: Erroll Luna, MD;  Location: [facility];  Service: General;  Laterality: Bilateral;  PECTORAL BLOCK, RNFA    There were no vitals filed for this visit.   Subjective Assessment - 12/28/20 1405    Subjective Got my sleeve and my daughter helped me try it on. Doing so much better.  Left shoulder is even much better.  I am in a better place.  I am  going to talk to a plastic surgeon to see if they can contour things better. Using my arms more normally, but I am still guarded because I don't want to flare my left shoulder    Pertinent History Prior hx of right Breast Cancer in 1996 with ALND of 28 LN's with prior radiation.  Most recent surgery on 07/29/2020 for Bilateral simple mastectomies with Right ductal Carcinoma in Situr ER+,PR+.  Pt has had bilateral shoulder surgeries and is presently having alot of left shoulder joint pain which was to have been evaluated by [name] but held secondary to her new diagnosis. Believes she has a RTC tear.    Patient Stated Goals Normal ROM, less pain/restriction.    Currently in Pain? No/denies    Pain Score 0-No pain              [facility] PT Assessment - 12/28/20 0001      Assessment   Medical Diagnosis Right DCIS/Bilateral mastectomies    Referring Provider (PT) Dr. Brantley Stage    Onset Date/Surgical Date 07/29/20      Precautions   Precaution Comments lymphedema risk   right     Cognition   Overall Cognitive Status Within Functional Limits for tasks assessed      AROM   Right Shoulder Extension 51 Degrees    Right Shoulder Flexion  164 Degrees    Right Shoulder ABduction 175 Degrees    Left Shoulder Extension 45 Degrees    Left Shoulder Flexion 160 Degrees    Left Shoulder ABduction 150 Degrees                                      PT Long Term Goals - 12/28/20 1431      PT LONG TERM GOAL #1   Title pt will be independent in HEP for bilateral shoulder ROM    Time 4    Period Weeks    Status Achieved      PT LONG TERM GOAL #2   Title Quick dash will be no greater than 20%    Baseline 41%    Time 6    Period Weeks    Status Unable to assess   forgotten today     PT LONG TERM GOAL #3   Title Pts AROM shoulders flexion and abd will increase to atleast 155 degrees for improved reaching ability    Baseline achieved for right and left flexion and right  abd.  Left abd 150    Time 4    Period Weeks    Status Achieved   achieved for all except left abd 150     PT LONG TERM GOAL #4   Title Pt will have decreased complaints of tightness by atleast 50%    Time 6    Period Weeks    Status Achieved      PT LONG TERM GOAL #5   Title pt will be fit for appropriate compression garments (bra/sleeve) prn    Baseline has sleeve, not yet bra since insurance wouldn't pay    Time 6    Period Weeks    Status Achieved                 Plan - 12/28/20 1458    Clinical Impression Statement Pt is  here today to have garment checked and to be reassessed.  She was shown how to don/doff compression sleeve using rubber gloves and was given several pieces of foam to prevent sleeve rolling.  We checked ROM and all pt. goals.  Pt has achieved all goals except Quick dash which was forgotten today.  She is in a good place and will try and follow up with [facility]) soon.  Cording is not evident or restricting.  Extra tissue still noted at left lateral trunk from skin sparing mastectomy. She will speak with Dr. Brantley Stage about this next week.  Tissues are soft and scar is much less restricted.   Personal Factors and Comorbidities Comorbidity 3+    Comorbidities prior Breast CA with radiation, psoriatic arthritis, Left shoulder Pain (potential RTC tear)    Stability/Clinical Decision Making Stable/Uncomplicated    Rehab Potential Good    PT Frequency 2x / week    PT Duration 8 weeks    PT Treatment/Interventions ADLs/Self Care Home Management;Therapeutic exercise;Patient/family education;Manual techniques;Manual lymph drainage;Scar mobilization;Joint Manipulations    PT Next Visit Plan DC to HEP    Recommended Other Services has sleeve, suggested compression bra    Consulted and Agree with Plan of Care Patient           Patient will benefit from skilled therapeutic intervention in order to improve the following deficits and impairments:  Decreased skin  integrity,Increased fascial restricitons,Impaired sensation,Decreased scar mobility,Postural dysfunction,Decreased activity  tolerance,Decreased range of motion,Impaired UE functional use,Decreased knowledge of precautions,Increased edema,Impaired flexibility  Visit Diagnosis: Aftercare following surgery for neoplasm  Abnormal posture  Chronic left shoulder pain  Intraductal carcinoma in situ of right breast     Problem List Patient Active Problem List   Diagnosis Date Noted  . Pain in left shoulder 10/06/2020  . Rash 08/12/2020  . Genetic testing 07/26/2020  . Ductal carcinoma in situ (DCIS) of right breast 07/05/2020  . Family history of breast cancer   . Family history of prostate cancer   . Lumbar paraspinal muscle spasm 10/16/2018  . Chronic insomnia 05/31/2015  . Hypothyroidism following radioiodine therapy 05/15/2011  . HYPERTHYROIDISM 11/03/2009  . MICROSCOPIC HEMATURIA 11/01/2009  . THYROID FUNCTION TEST, ABNORMAL 11/01/2009  . COLONIC POLYPS 10/08/2009  . EDEMA 10/08/2009  . NEOPLASM, MALIGNANT, BREAST, HX OF 10/08/2009  PHYSICAL THERAPY DISCHARGE SUMMARY  Visits from Start of Care: 9  Current functional level related to goals / functional outcomes: Achieved goals established except quick dash forgotten   Remaining deficits: NA   Education / Equipment: Compression sleeve Plan: Patient agrees to discharge.  Patient goals were not met. Patient is being discharged due to meeting the stated rehab goals.  ?????      Claris Pong 12/28/2020, 5:59 PM  [place], [place], [address] Phone: [phone]   Fax:  [phone]  Name: Alicia Mcconnell MRN: [id number] Date of Birth: [birth year] Cheral Almas, PT 12/28/20 6:04 PM

## 2021-02-18 ENCOUNTER — Telehealth (INDEPENDENT_AMBULATORY_CARE_PROVIDER_SITE_OTHER): Payer: No Typology Code available for payment source | Admitting: Adult Health

## 2021-02-18 DIAGNOSIS — L089 Local infection of the skin and subcutaneous tissue, unspecified: Secondary | ICD-10-CM | POA: Diagnosis not present

## 2021-02-18 MED ORDER — DOXYCYCLINE HYCLATE 100 MG PO CAPS: 100.0000 mg | ORAL_CAPSULE | Freq: Two times a day (BID) | ORAL | 0 refills | Status: DC

## 2021-02-18 NOTE — Progress Notes (Signed)
Virtual Visit via Video Note  I connected with Alicia Mcconnell on 02/18/21 at  4:00 PM EDT by a video enabled telemedicine application and verified that I am speaking with the correct person using two identifiers.  Location patient: home Location provider:work or home office Persons participating in the virtual visit: patient, provider  I discussed the limitations of evaluation and management by telemedicine and the availability of in person appointments. The patient expressed understanding and agreed to proceed.   HPI: 60 year old female who is being evaluated today for rash on her face.  Recently placed back on Enbrel and methotrexate by rheumatology after being treated for breast cancer.  Over the last month or so she has developed a rash on her face.  Rash has been subtle . Rash is red and itchy and has pus filled papules.  She contacted her rheumatologist who advised her to stop methotrexate and Enbrel and follow-up with her PCP for concern of a skin infection   ROS: See pertinent positives and negatives per HPI.  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  ER posivtive      COLONIC POLYPS 10/08/2009   Edema 10/08/2009   Family history of breast cancer    Family history of prostate cancer    Graves disease    HYPERTHYROIDISM 11/03/2009   Hypothyroidism    Microscopic hematuria 11/01/2009   NEOPLASM, MALIGNANT, BREAST, HX OF 10/08/2009   THYROID FUNCTION TEST, ABNORMAL 11/01/2009    Past Surgical History:  Procedure Laterality Date   BREAST SURGERY     breast CA, 6 weeks radiation   CHOLECYSTECTOMY     labrum tear Right 2/12   SIMPLE MASTECTOMY WITH AXILLARY SENTINEL NODE BIOPSY Bilateral 07/29/2020   Procedure: BILATERAL SIMPLE MASTECTOMY;  Surgeon:  Erroll Luna, MD;  Location: [facility];  Service: General;  Laterality: Bilateral;  PECTORAL BLOCK, RNFA    Family History  Problem Relation Age of Onset   Arthritis Mother 20       RA   Alcohol abuse Father    Hypertension Father    COPD Father        d. 41   Alcohol abuse Sister    Alcohol abuse Brother    Breast cancer Paternal Aunt        x2 dx   Heart attack Maternal Grandfather    Prostate cancer Paternal Grandfather        dx 67s   Healthy Daughter        Current Outpatient Medications:    doxycycline (VIBRAMYCIN) 100 MG capsule, Take 1 capsule (100 mg total) by mouth 2 (two) times daily., Disp: 14 capsule, Rfl: 0   cetirizine (ZYRTEC) 10 MG tablet, Take 1 tablet (10 mg total) by mouth daily. For at least 2 weeks, Disp: 30 tablet, Rfl: 1   cyclobenzaprine (FLEXERIL) 5 MG tablet, Take 1 tablet (5 mg total) by mouth 3 (three) times daily as needed for muscle spasms., Disp: 40 tablet, Rfl: 1   etanercept (ENBREL SURECLICK) 50 MG/ML injection, Inject 50 mg into the skin once a week., Disp: , Rfl:    fluticasone (FLONASE) 50 MCG/ACT nasal spray, PLACE 1 SPRAY INTO BOTH NOSTRILS DAILY. (Patient taking differently: Place 1 spray into both nostrils as needed for allergies  or rhinitis.), Disp: 48 mL, Rfl: 1   folic acid (FOLVITE) 1 MG tablet, Take 1 mg by mouth daily., Disp: , Rfl:    gabapentin (NEURONTIN) 100 MG capsule, TAKE 2 CAPSULES (200 MG TOTAL) BY MOUTH AT BEDTIME., Disp: 180 capsule, Rfl: 2   hydrOXYzine (ATARAX/VISTARIL) 25 MG tablet, Take 25 mg by mouth 3 (three) times daily., Disp: , Rfl:    levothyroxine (SYNTHROID) 125 MCG tablet, Take 125 mcg by mouth daily., Disp: , Rfl:    linaclotide (LINZESS) 290 MCG CAPS capsule, Take 290 mcg by mouth as needed (chronic constipation)., Disp: , Rfl:    meloxicam (MOBIC) 15 MG tablet, TAKE 1 TABLET BY MOUTH EVERY DAY (Patient taking differently: Take 15 mg by mouth daily.), Disp: 30 tablet, Rfl: 0   methotrexate  (RHEUMATREX) 2.5 MG tablet, Take 20 mg by mouth once a week. PATIENT TAKES (8) 2.5GM TABLETS WEEKLY, Disp: , Rfl:    oxyCODONE (OXY IR/ROXICODONE) 5 MG immediate release tablet, Take 1 tablet (5 mg total) by mouth every 6 (six) hours as needed for severe pain., Disp: 15 tablet, Rfl: 0  EXAM:  VITALS per patient if applicable:  GENERAL: alert, oriented, appears well and in no acute distress  HEENT: atraumatic, conjunttiva clear, no obvious abnormalities on inspection of external nose and ears  NECK: normal movements of the head and neck  LUNGS: on inspection no signs of respiratory distress, breathing rate appears normal, no obvious gross SOB, gasping or wheezing  CV: no obvious cyanosis  MS: moves all visible extremities without noticeable abnormality  PSYCH/NEURO: pleasant and cooperative, no obvious depression or anxiety, speech and thought processing grossly intact  SKIN: Difficult to tell on camera but she does appear to have a red papular rash throughout various areas of her face.  ASSESSMENT AND PLAN:  Discussed the following assessment and plan:  1. Skin infection - Will treat with doxycycline. Advised follow up with PCP if not improving in the next 3-4 days  - doxycycline (VIBRAMYCIN) 100 MG capsule; Take 1 capsule (100 mg total) by mouth 2 (two) times daily.  Dispense: 14 capsule; Refill: 0    I discussed the assessment and treatment plan with the patient. The patient was provided an opportunity to ask questions and all were answered. The patient agreed with the plan and demonstrated an understanding of the instructions.   The patient was advised to call back or seek an in-person evaluation if the symptoms worsen or if the condition fails to improve as anticipated.   Dorothyann Peng, NP

## 2021-04-08 IMAGING — CT CT ANGIO CHEST
2 of 6 series · 19 of 46 positions shown · IV contrast (omnipaque)
Comparison: Chest x-ray [REDACTED] 1311

CLINICAL DATA: Chest pain and shortness of breath. Breast cancer
recurrence this year status post double mastectomy.

EXAM:
CT ANGIOGRAPHY CHEST WITH CONTRAST
TECHNIQUE: Multidetector CT imaging of the chest was performed using the
standard protocol during bolus administration of intravenous
contrast. Multiplanar CT image reconstructions and MIPs were
obtained to evaluate the vascular anatomy.
CONTRAST:  80mL OMNIPAQUE IOHEXOL 350 MG/ML SOLN

[Series 6: thins · axial · 0.66mm/px · z∈[-18,+284]mm · 16 of 332 slices shown]
[im 15/332  lung]
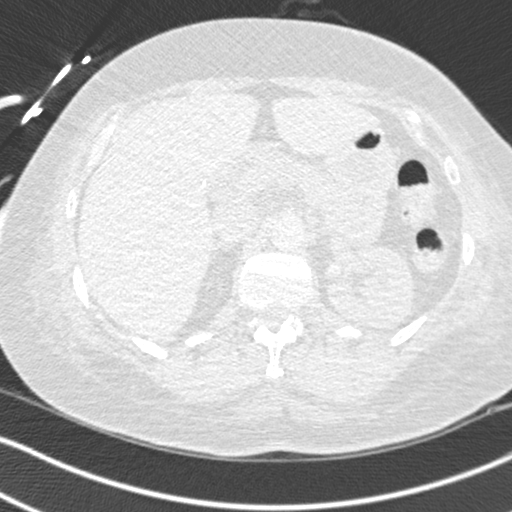
[im 44/332  soft-tissue]
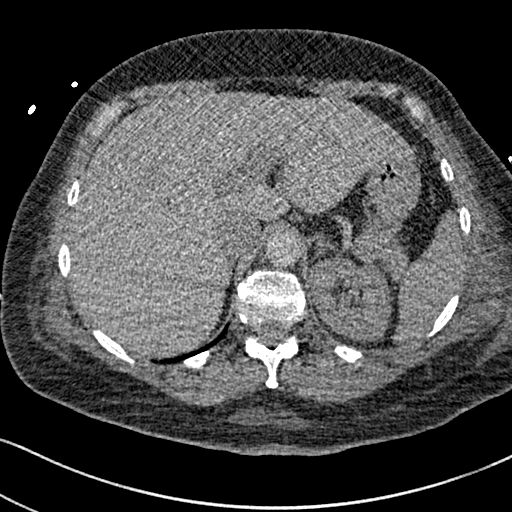
[im 58/332  lung]
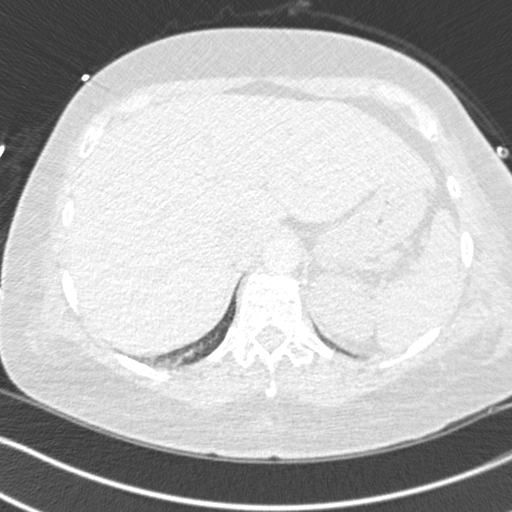
[im 72/332  soft-tissue]
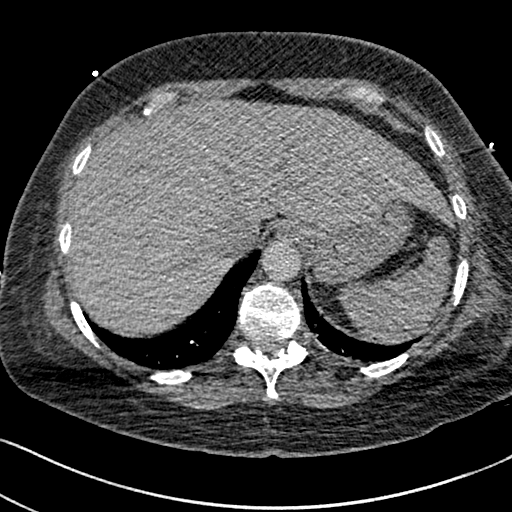
[im 101/332  lung]
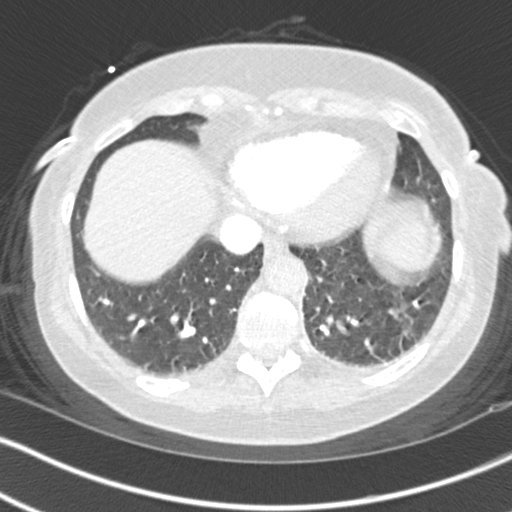
[im 116/332  soft-tissue]
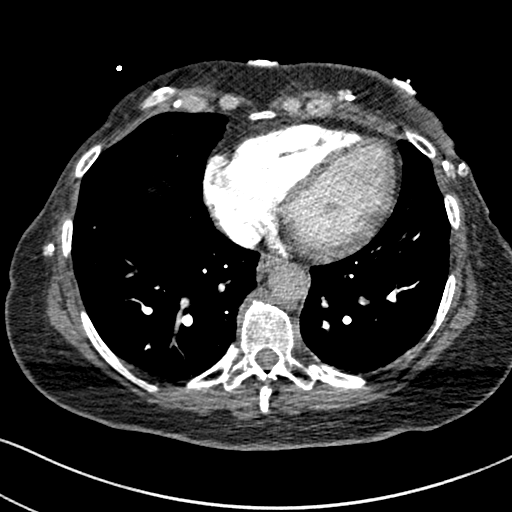
[im 130/332  lung]
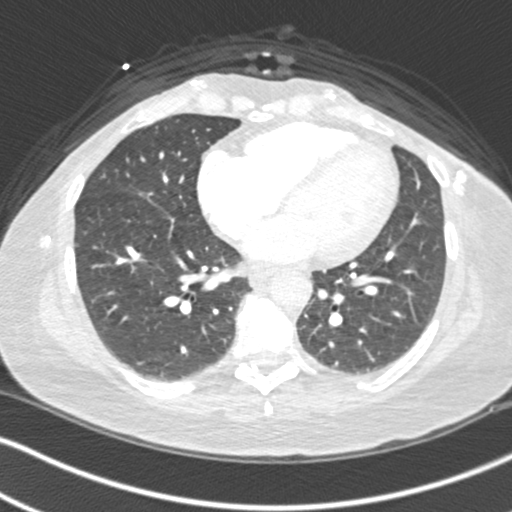
[im 159/332  soft-tissue]
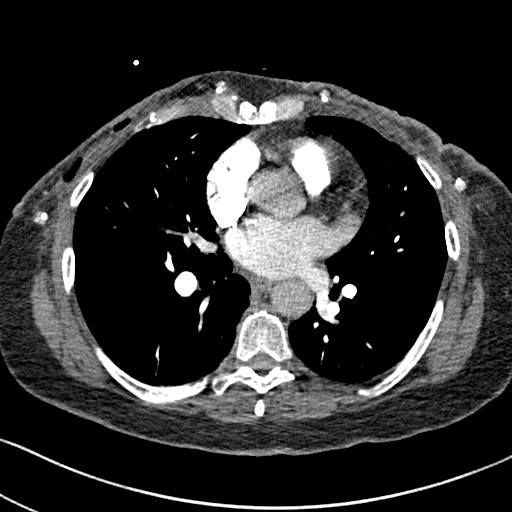
[im 173/332  lung]
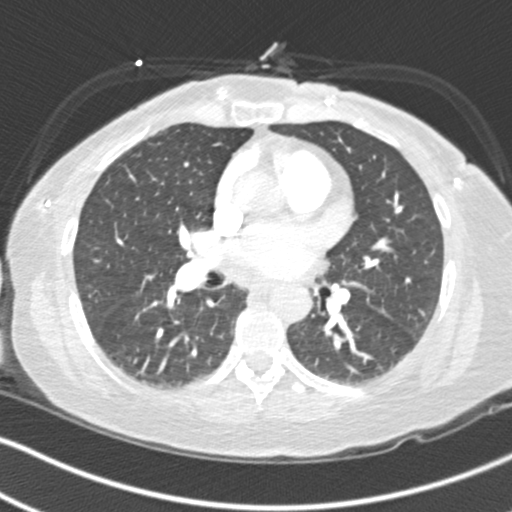
[im 202/332  soft-tissue]
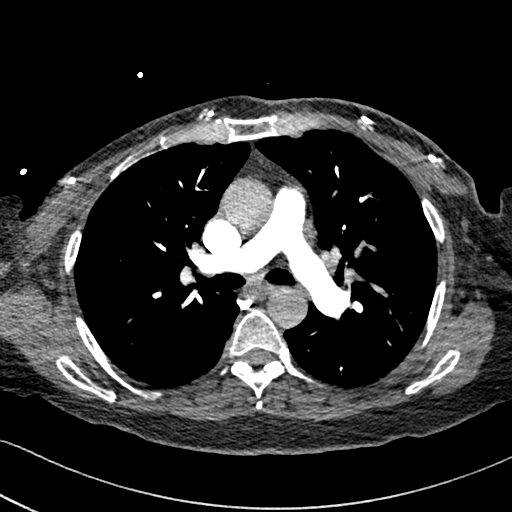
[im 216/332  lung]
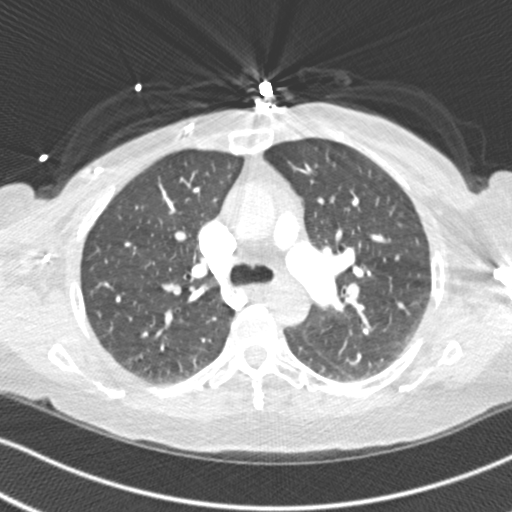
[im 231/332  soft-tissue]
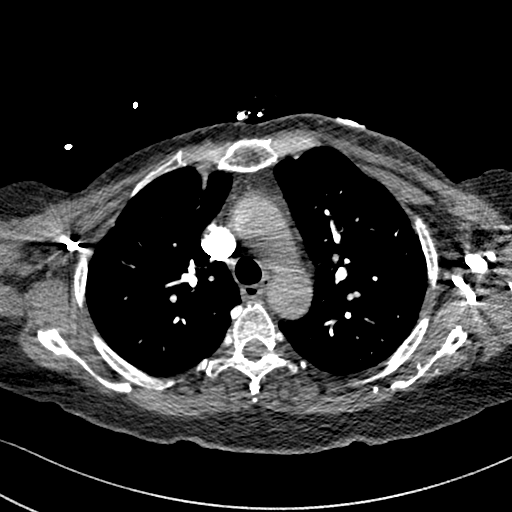
[im 260/332  lung]
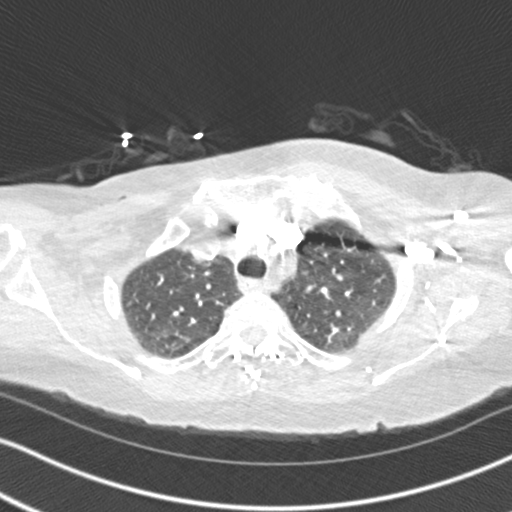
[im 274/332  soft-tissue]
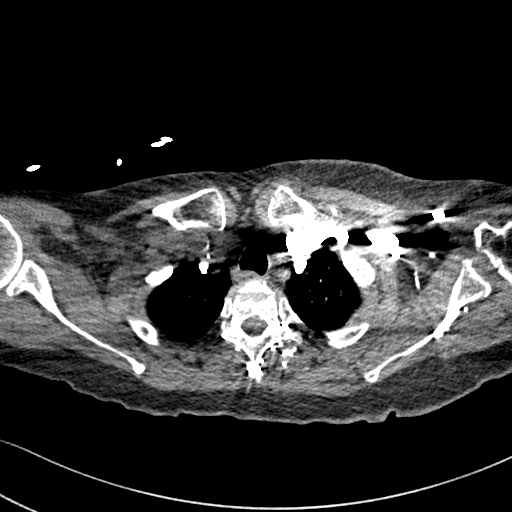
[im 288/332  lung]
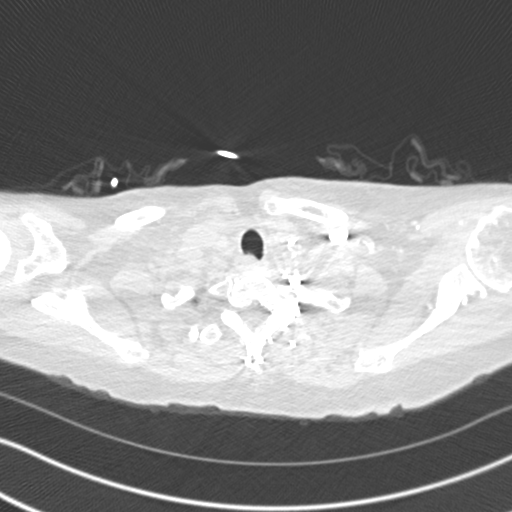
[im 317/332  soft-tissue]
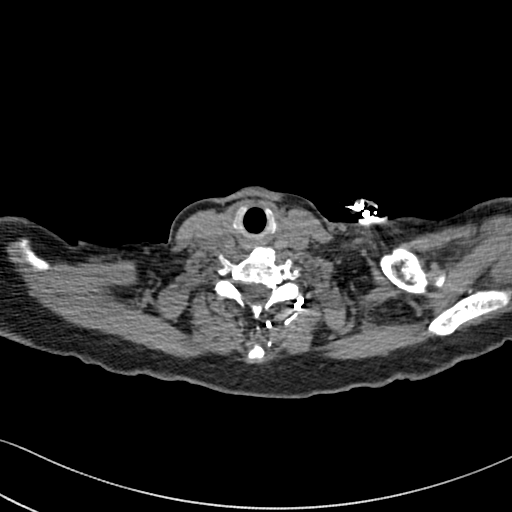

[Series 8: coronal mpr · coronal · 0.65mm/px · 3 of 151 slices shown]
[im 38/151  soft-tissue]
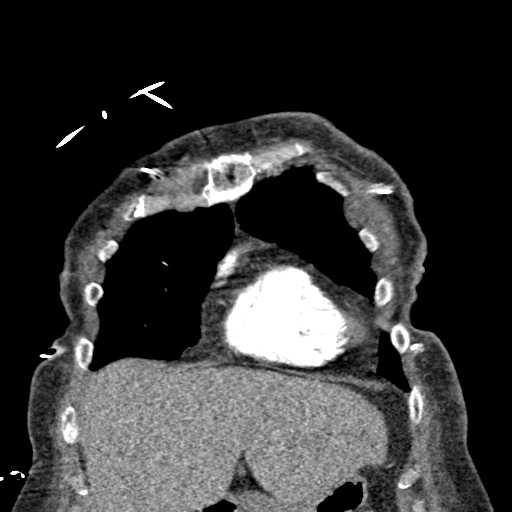
[im 76/151  soft-tissue]
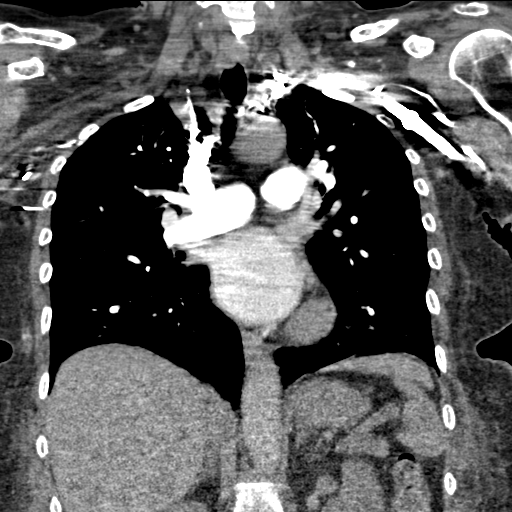
[im 113/151  soft-tissue]
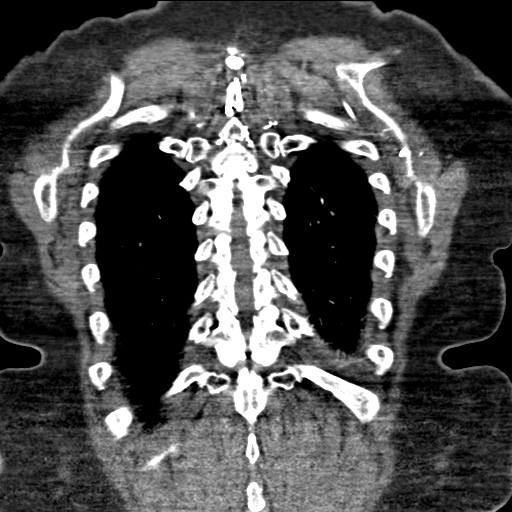

[19 of 46 positions shown; findings below may reference images not displayed]

FINDINGS: Cardiovascular: Satisfactory opacification of the pulmonary arteries
to the segmental level. No evidence of pulmonary embolism. Normal
heart size. No pericardial effusion.

Mediastinum/Nodes: No enlarged mediastinal, hilar, or axillary lymph
nodes. Thyroid gland, trachea, and esophagus demonstrate no
significant findings.

Lungs/Pleura: Lungs are clear. No pleural effusion or pneumothorax.

Upper Abdomen: No acute abnormality.

Musculoskeletal: Degenerative joint changes of the spine are noted.
No definite focal bony lesions are noted. Subcutaneous air is
identified over the anterior right chest question postoperative
change. Clinical correlation regarding date of mastectomy is
suggested.

Review of the MIP images confirms the above findings.
IMPRESSION: 1. No pulmonary embolus.
2. Subcutaneous air is identified over the anterior right chest
question postoperative change. Clinical correlation regarding date
of mastectomy is suggested.

## 2021-04-08 IMAGING — CR DG CHEST 2V
2 series · 2 of 2 positions shown · non-contrast
Comparison: Chest radiograph 01/21/2017.

CLINICAL DATA: Chest pain and shortness of breath.

EXAM:
CHEST - 2 VIEW

[chest pa]
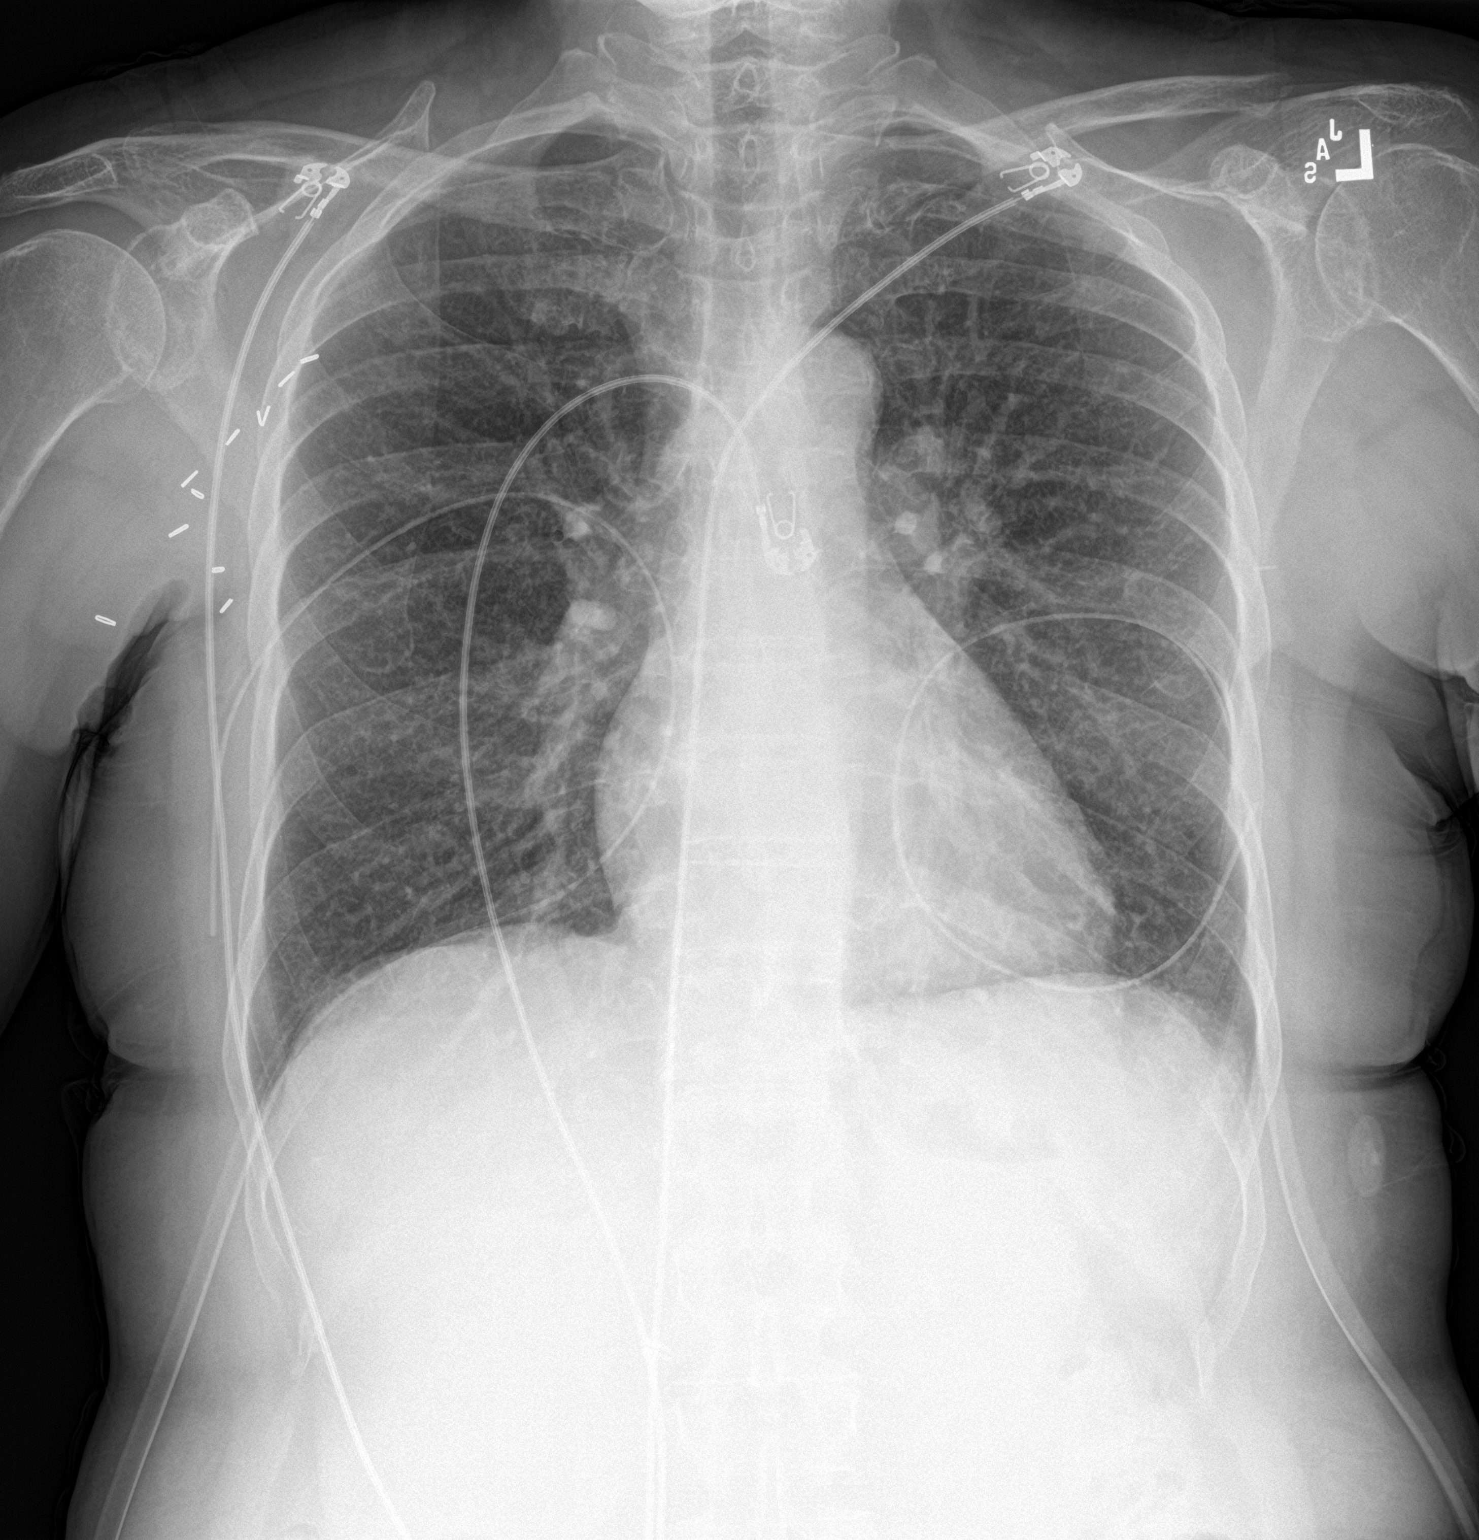

[chest lat]
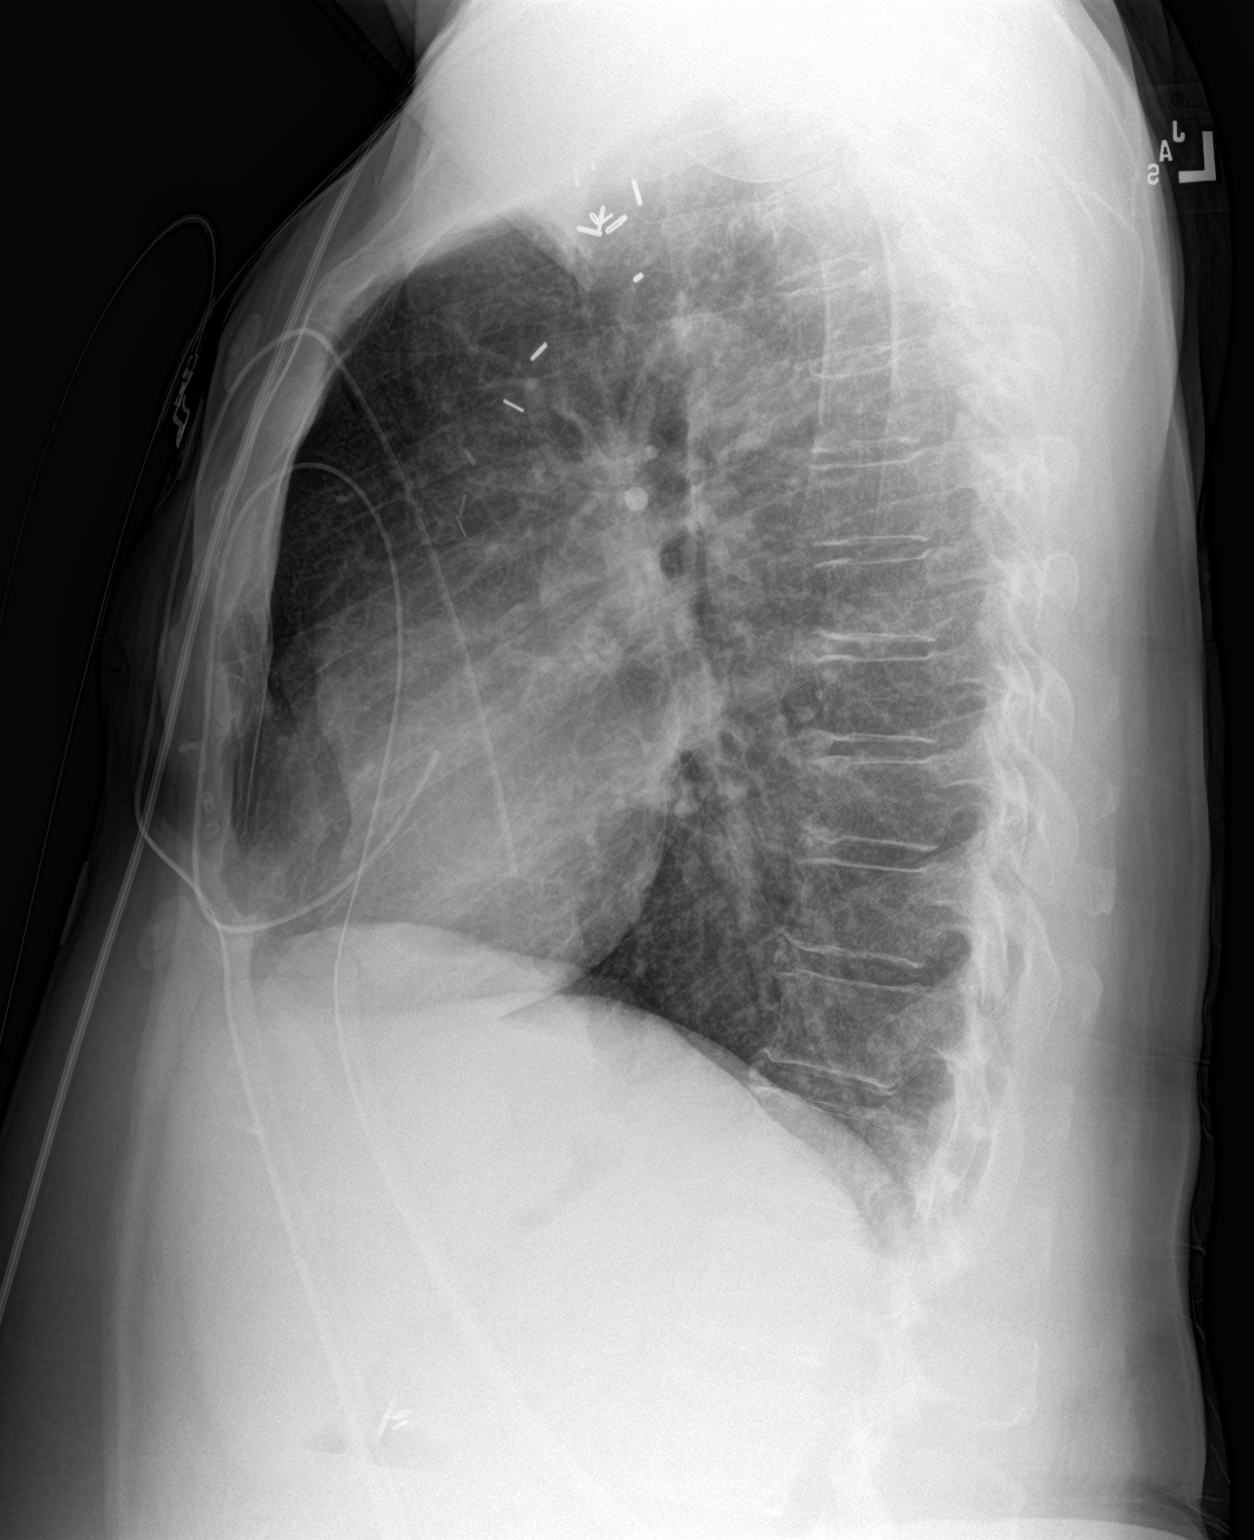

[2 of 2 positions shown; findings below may reference images not displayed]

FINDINGS: Monitoring leads overlie the patient. Stable cardiac and mediastinal
contours. Bilateral anterior chest wall drains from mastectomies
demonstrated. Right axillary surgical clips. No large area pulmonary
consolidation. No pleural effusion or pneumothorax. Cholecystectomy
clips.
IMPRESSION: No acute cardiopulmonary process.

## 2021-09-12 ENCOUNTER — Ambulatory Visit (INDEPENDENT_AMBULATORY_CARE_PROVIDER_SITE_OTHER): Payer: No Typology Code available for payment source | Admitting: Family Medicine

## 2021-09-12 ENCOUNTER — Encounter: Payer: Self-pay | Admitting: Family Medicine

## 2021-09-12 VITALS — BP 100/60 | HR 65 | Temp 97.8°F | Ht 67.0 in | Wt 219.0 lb

## 2021-09-12 DIAGNOSIS — M25471 Effusion, right ankle: Secondary | ICD-10-CM

## 2021-09-12 NOTE — Progress Notes (Signed)
Established Patient Office Visit  Subjective:  [name], female    DOB: 1961/06/15  Age: 61 y.o. MRN: [id number]  CC:  Chief Complaint  Patient presents with   Ankle Problem    HPI [name] presents for right ankle edema and lower leg edema which she noticed over the weekend which lasted a couple days but actually is resolved at this time. She states about 6 weeks ago she was driving with her daughter on a short trip and noticed a sharp pain in her right inner upper thigh region.  This did not feel like a muscle cramp.  She she stated this felt like a "vasospasm ".  Quality was very sharp.  Pain fully resolved afterwards.  Did not see any swelling at that time.  She was in [place] last week.  She flew.  Her initial concern was DVT with the swelling.  However, as above, edema is fully resolved at this time.  No calf pain.  No thigh pain.  No dyspnea.  She has history of breast cancer about 25 years ago and then unfortunately had recurrence of separate cancer in the same breast back in December 2021.  She underwent bilateral mastectomy July 29, 2020.  She was then pleased with the cosmetic result and is looking at possible revision surgery with another surgeon at some point.  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  ER posivtive      COLONIC POLYPS 10/08/2009   Edema 10/08/2009   Family history of breast cancer    Family history of prostate cancer    Graves disease    HYPERTHYROIDISM 11/03/2009   Hypothyroidism    Microscopic hematuria 11/01/2009   NEOPLASM, MALIGNANT, BREAST, HX OF 10/08/2009   THYROID FUNCTION TEST, ABNORMAL 11/01/2009    Past Surgical History:  Procedure Laterality Date   BREAST SURGERY     breast CA, 6 weeks  radiation   CHOLECYSTECTOMY     labrum tear Right 2/12   SIMPLE MASTECTOMY WITH AXILLARY SENTINEL NODE BIOPSY Bilateral 07/29/2020   Procedure: BILATERAL SIMPLE MASTECTOMY;  Surgeon: Erroll Luna, MD;  Location: [facility];  Service: General;  Laterality: Bilateral;  PECTORAL BLOCK, RNFA    Family History  Problem Relation Age of Onset   Arthritis Mother 83       RA   Alcohol abuse Father    Hypertension Father    COPD Father        d. 52   Alcohol abuse Sister    Alcohol abuse Brother    Breast cancer Paternal Aunt        x2 dx   Heart attack Maternal Grandfather    Prostate cancer Paternal Grandfather        dx 10s   Healthy Daughter     Social History   Socioeconomic History   Marital status: Divorced    Spouse name: Not on file   Number of children: Not on file   Years of education: Not on file   Highest education level: Not on file  Occupational History   Not on file  Tobacco Use   Smoking status: Former    Packs/day: 0.50    Years: 25.00    Pack years: 12.50  Types: Cigarettes    Quit date: 06/29/2020    Years since quitting: 1.2   Smokeless tobacco: Never  Substance and Sexual Activity   Alcohol use: Yes    Alcohol/week: 0.0 standard drinks    Comment: rare   Drug use: No   Sexual activity: Not Currently    Birth control/protection: Post-menopausal  Other Topics Concern   Not on file  Social History Narrative   Not on file   Social Determinants of Health   Financial Resource Strain: Not on file  Food Insecurity: Not on file  Transportation Needs: Not on file  Physical Activity: Not on file  Stress: Not on file  Social Connections: Not on file  Intimate Partner Violence: Not on file    Outpatient Medications Prior to Visit  Medication Sig Dispense Refill   betamethasone dipropionate 0.05 % cream Apply 1 application topically 2 (two) times daily.     CEQUA 0.09 % SOLN Apply 1 drop to eye 2 (two) times daily.     cetirizine  (ZYRTEC) 10 MG tablet Take 1 tablet (10 mg total) by mouth daily. For at least 2 weeks 30 tablet 1   etanercept (ENBREL SURECLICK) 50 MG/ML injection Inject 50 mg into the skin once a week.     fluticasone (FLONASE) 50 MCG/ACT nasal spray PLACE 1 SPRAY INTO BOTH NOSTRILS DAILY. (Patient taking differently: Place 1 spray into both nostrils as needed for allergies or rhinitis.) 48 mL 1   gabapentin (NEURONTIN) 100 MG capsule TAKE 2 CAPSULES (200 MG TOTAL) BY MOUTH AT BEDTIME. 180 capsule 2   levothyroxine (SYNTHROID) 125 MCG tablet Take 125 mcg by mouth daily.     linaclotide (LINZESS) 290 MCG CAPS capsule Take 290 mcg by mouth as needed (chronic constipation).     meloxicam (MOBIC) 15 MG tablet TAKE 1 TABLET BY MOUTH EVERY DAY (Patient taking differently: Take 15 mg by mouth daily.) 30 tablet 0   cyclobenzaprine (FLEXERIL) 5 MG tablet Take 1 tablet (5 mg total) by mouth 3 (three) times daily as needed for muscle spasms. 40 tablet 1   doxycycline (VIBRAMYCIN) 100 MG capsule Take 1 capsule (100 mg total) by mouth 2 (two) times daily. 14 capsule 0   folic acid (FOLVITE) 1 MG tablet Take 1 mg by mouth daily.     hydrOXYzine (ATARAX/VISTARIL) 25 MG tablet Take 25 mg by mouth 3 (three) times daily.     methotrexate (RHEUMATREX) 2.5 MG tablet Take 20 mg by mouth once a week. PATIENT TAKES (8) 2.5GM TABLETS WEEKLY     oxyCODONE (OXY IR/ROXICODONE) 5 MG immediate release tablet Take 1 tablet (5 mg total) by mouth every 6 (six) hours as needed for severe pain. 15 tablet 0   No facility-administered medications prior to visit.    Allergies  Allergen Reactions   Methimazole Hives, Itching and Rash   Sulfa Antibiotics Nausea And Vomiting and Hives    hives   Methimazole Hives    hives    ROS Review of Systems  Constitutional:  Negative for chills and fever.  Respiratory:  Negative for shortness of breath.   Cardiovascular:  Negative for chest pain.     Objective:    Physical Exam Vitals  reviewed.  Constitutional:      Appearance: Normal appearance.  Cardiovascular:     Rate and Rhythm: Normal rate and regular rhythm.  Pulmonary:     Effort: Pulmonary effort is normal.     Breath sounds: Normal breath sounds.  Musculoskeletal:  Right lower leg: No edema.     Left lower leg: No edema.     Comments: No evidence for edema in her right foot, ankle, or leg at this time.  No calf pain.  Popliteal fossa of the knee reveals no significant Baker's cyst.  No visible bruising.  No erythema or warmth of the lower extremity.  Neurological:     Mental Status: She is alert.    BP 100/60    Pulse 65    Temp 97.8 F (36.6 C) (Oral)    Ht 5\' 7"  (1.702 m)    Wt 219 lb (99.3 kg)    SpO2 96%    BMI 34.30 kg/m  Wt Readings from Last 3 Encounters:  09/12/21 219 lb (99.3 kg)  10/06/20 190 lb (86.2 kg)  08/14/20 190 lb (86.2 kg)     Health Maintenance Due  Topic Date Due   COVID-19 Vaccine (1) Never done   HIV Screening  Never done   Hepatitis C Screening  Never done   Zoster Vaccines- Shingrix (1 of 2) Never done   PAP SMEAR-Modifier  03/23/2019   TETANUS/TDAP  10/09/2019   INFLUENZA VACCINE  02/21/2021    There are no preventive care reminders to display for this patient.  Lab Results  Component Value Date   TSH 2.83 05/05/2011   Lab Results  Component Value Date   WBC 14.1 (H) 08/14/2020   HGB 11.1 (L) 08/14/2020   HCT 34.4 (L) 08/14/2020   MCV 102.4 (H) 08/14/2020   PLT 280 08/14/2020   Lab Results  Component Value Date   NA 139 08/14/2020   K 3.6 08/14/2020   CO2 21 (L) 08/14/2020   GLUCOSE 132 (H) 08/14/2020   BUN 21 (H) 08/14/2020   CREATININE 0.67 08/14/2020   BILITOT 0.5 08/14/2020   ALKPHOS 44 08/14/2020   AST 22 08/14/2020   ALT 36 08/14/2020   PROT 5.5 (L) 08/14/2020   ALBUMIN 2.7 (L) 08/14/2020   CALCIUM 8.4 (L) 08/14/2020   ANIONGAP 10 08/14/2020   GFR 88.74 05/05/2011   Lab Results  Component Value Date   CHOL 170 05/05/2011   Lab  Results  Component Value Date   HDL 54.30 05/05/2011   Lab Results  Component Value Date   LDLCALC 108 (H) 05/05/2011   Lab Results  Component Value Date   TRIG 37.0 05/05/2011   Lab Results  Component Value Date   CHOLHDL 3 05/05/2011   No results found for: HGBA1C    Assessment & Plan:   Transient right lower extremity edema-now resolved.  Clinically does not have any suggestion of DVT at this time with edema fully resolved and no calf or thigh pain at this time.  Recommend close observation.  If she has any recurrent swelling venous Dopplers.  Patient will schedule complete physical soon.  No orders of the defined types were placed in this encounter.   Follow-up: No follow-ups on file.    Carolann Littler, MD

## 2021-09-12 NOTE — Patient Instructions (Signed)
Follow up for any recurrent swelling- or right lower extremity pain.

## 2022-01-20 ENCOUNTER — Encounter: Payer: No Typology Code available for payment source | Admitting: Family Medicine

## 2022-03-28 ENCOUNTER — Encounter: Payer: No Typology Code available for payment source | Admitting: Family Medicine

## 2022-05-02 ENCOUNTER — Ambulatory Visit (INDEPENDENT_AMBULATORY_CARE_PROVIDER_SITE_OTHER): Payer: No Typology Code available for payment source | Admitting: Family Medicine

## 2022-05-02 ENCOUNTER — Encounter: Payer: Self-pay | Admitting: Family Medicine

## 2022-05-02 VITALS — BP 126/80 | HR 70 | Temp 97.9°F | Ht 67.72 in | Wt 222.9 lb

## 2022-05-02 DIAGNOSIS — Z0000 Encounter for general adult medical examination without abnormal findings: Secondary | ICD-10-CM | POA: Diagnosis not present

## 2022-05-02 LAB — CBC WITH DIFFERENTIAL/PLATELET
Basophils Absolute: 0.1 10*3/uL (ref 0.0–0.1)
Basophils Relative: 1.8 % (ref 0.0–3.0)
Eosinophils Absolute: 0.3 10*3/uL (ref 0.0–0.7)
Eosinophils Relative: 6.4 % — ABNORMAL HIGH (ref 0.0–5.0)
HCT: 42.4 % (ref 36.0–46.0)
Hemoglobin: 14.5 g/dL (ref 12.0–15.0)
Lymphocytes Relative: 44.6 % (ref 12.0–46.0)
Lymphs Abs: 2 10*3/uL (ref 0.7–4.0)
MCHC: 34.2 g/dL (ref 30.0–36.0)
MCV: 96.7 fl (ref 78.0–100.0)
Monocytes Absolute: 0.4 10*3/uL (ref 0.1–1.0)
Monocytes Relative: 7.8 % (ref 3.0–12.0)
Neutro Abs: 1.8 10*3/uL (ref 1.4–7.7)
Neutrophils Relative %: 39.4 % — ABNORMAL LOW (ref 43.0–77.0)
Platelets: 269 10*3/uL (ref 150.0–400.0)
RBC: 4.39 Mil/uL (ref 3.87–5.11)
RDW: 12.8 % (ref 11.5–15.5)
WBC: 4.6 10*3/uL (ref 4.0–10.5)

## 2022-05-02 LAB — LIPID PANEL
Cholesterol: 223 mg/dL — ABNORMAL HIGH (ref 0–200)
HDL: 65.1 mg/dL (ref >39.00)
LDL Cholesterol: 135 mg/dL — ABNORMAL HIGH (ref 0–99)
NonHDL: 157.79
Total CHOL/HDL Ratio: 3
Triglycerides: 113 mg/dL (ref 0.0–149.0)
VLDL: 22.6 mg/dL (ref 0.0–40.0)

## 2022-05-02 LAB — BASIC METABOLIC PANEL
BUN: 11 mg/dL (ref 6–23)
CO2: 27 mEq/L (ref 19–32)
Calcium: 10 mg/dL (ref 8.4–10.5)
Chloride: 101 mEq/L (ref 96–112)
Creatinine, Ser: 0.64 mg/dL (ref 0.40–1.20)
GFR: 96.49 mL/min (ref >60.00)
Glucose, Bld: 92 mg/dL (ref 70–99)
Potassium: 4.3 mEq/L (ref 3.5–5.1)
Sodium: 139 mEq/L (ref 135–145)

## 2022-05-02 LAB — HEPATIC FUNCTION PANEL
ALT: 33 U/L (ref 0–35)
AST: 25 U/L (ref 0–37)
Albumin: 4.5 g/dL (ref 3.5–5.2)
Alkaline Phosphatase: 59 U/L (ref 39–117)
Bilirubin, Direct: 0.1 mg/dL (ref 0.0–0.3)
Total Bilirubin: 0.7 mg/dL (ref 0.2–1.2)
Total Protein: 7.7 g/dL (ref 6.0–8.3)

## 2022-05-02 MED ORDER — OMEPRAZOLE 40 MG PO CPDR: 40.0000 mg | DELAYED_RELEASE_CAPSULE | Freq: Every day | ORAL | 1 refills | Status: DC

## 2022-05-02 MED ORDER — PHENTERMINE HCL 37.5 MG PO CAPS: 37.5000 mg | ORAL_CAPSULE | ORAL | 1 refills | Status: AC

## 2022-05-02 NOTE — Patient Instructions (Signed)
Consider Tetanus, flu vaccine, and Shingrix this year.

## 2022-05-02 NOTE — Progress Notes (Signed)
Established Patient Office Visit  Subjective   [name], female    DOB: 09/28/1960  Age: 61 y.o. MRN: [id number]  Chief Complaint  Patient presents with   Annual Exam    HPI   Alicia Mcconnell is here for complete physical.  She has history of hypothyroidism followed by endocrinology, breast cancer with history of bilateral mastectomies.  She is followed by GYN.  Pap smear up-to-date.  No longer gets mammograms because of her mastectomies.  Last tetanus over 10 years ago.  No history of Shingrix.  No history of flu vaccine.  She declines Shingrix at this time.    Has recently had some increased reflux symptoms.  Has been taking omeprazole 40 mg daily and requesting refill.  She is frustrated with weight gain and inability to lose.  She had a lot of stress past few years and while she is stress eating.  Specifically requests trial of phentermine short-term to help jumpstart her weight loss.  She is has difficulty with impulse eating.  Family history-her parents are both deceased.  Father had COPD and history of alcohol abuse and hypertension.  She has both a sister and brother with history of alcohol abuse.  Mother had rheumatoid arthritis diagnosed age 35.  Social history-she is divorced.  She quit smoking couple years ago.  Light smoker.  No regular alcohol.  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  Health Maintenance  Topic Date Due   COVID-19 Vaccine (1) Never done   HIV Screening  Never done   Hepatitis C Screening  Never done   Zoster Vaccines- Shingrix (1 of 2) 08/02/2022 (Originally 07/24/1981)   INFLUENZA VACCINE  10/22/2022 (Originally 02/21/2022)   TETANUS/TDAP  05/03/2023 (Originally 10/09/2019)   PAP SMEAR-Modifier  04/21/2025   COLONOSCOPY (Pts 45-86yr Insurance coverage will need to be confirmed)  03/20/2029   HPV VACCINES  Aged Out   MAMMOGRAM  Discontinued     Past  Medical History:  Diagnosis Date   ALLERGIC RHINITIS 10/08/2009   Arthritis    psoriatic arthritis-on Enbrel and Methotrexate   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  ER posivtive      COLONIC POLYPS 10/08/2009   Edema 10/08/2009   Family history of breast cancer    Family history of prostate cancer    Graves disease    HYPERTHYROIDISM 11/03/2009   Hypothyroidism    Microscopic hematuria 11/01/2009   NEOPLASM, MALIGNANT, BREAST, HX OF 10/08/2009   THYROID FUNCTION TEST, ABNORMAL 11/01/2009   Past Surgical History:  Procedure Laterality Date   BREAST SURGERY     breast CA, 6 weeks radiation   CHOLECYSTECTOMY     labrum tear Right 2/12   SIMPLE MASTECTOMY WITH AXILLARY SENTINEL NODE BIOPSY Bilateral 07/29/2020   Procedure: BILATERAL SIMPLE MASTECTOMY;  Surgeon: CErroll Luna MD;  Location[facility]  Service: General;  Laterality: Bilateral;  PECTORAL BLOCK, RNFA    reports that she quit smoking about 22 months ago. Her smoking use included cigarettes. She has a 12.50 pack-year smoking history. She has never used smokeless tobacco. She reports current alcohol use. She reports that she does not use drugs. family history includes Alcohol abuse in her brother, father, and sister; Arthritis (age of onset: 361 in her mother; Breast cancer in her paternal aunt; COPD  in her father; Healthy in her daughter; Heart attack in her maternal grandfather; Hypertension in her father; Prostate cancer in her paternal grandfather. Allergies  Allergen Reactions   Methimazole Hives, Itching and Rash   Sulfa Antibiotics Nausea And Vomiting and Hives    hives   Methimazole Hives    hives    Review of Systems  Constitutional:  Negative for chills, fever, malaise/fatigue and weight loss.  HENT:  Negative for hearing loss.   Eyes:  Negative for blurred vision and double vision.  Respiratory:   Negative for cough and shortness of breath.   Cardiovascular:  Negative for chest pain, palpitations and leg swelling.  Gastrointestinal:  Negative for abdominal pain, blood in stool, constipation and diarrhea.  Genitourinary:  Negative for dysuria.  Skin:  Negative for rash.  Neurological:  Negative for dizziness, speech change, seizures, loss of consciousness and headaches.  Psychiatric/Behavioral:  Negative for depression.       Objective:     BP 126/80 (BP Location: Left Arm, Patient Position: Sitting, Cuff Size: Large)   Pulse 70   Temp 97.9 F (36.6 C) (Oral)   Ht 5' 7.72" (1.72 m)   Wt 222 lb 14.4 oz (101.1 kg)   SpO2 100%   BMI 34.18 kg/m  BP Readings from Last 3 Encounters:  05/02/22 126/80  09/12/21 100/60  09/09/20 130/70   Wt Readings from Last 3 Encounters:  05/02/22 222 lb 14.4 oz (101.1 kg)  09/12/21 219 lb (99.3 kg)  10/06/20 190 lb (86.2 kg)      Physical Exam Constitutional:      Appearance: She is well-developed.  HENT:     Head: Normocephalic and atraumatic.  Eyes:     Pupils: Pupils are equal, round, and reactive to light.  Neck:     Thyroid: No thyromegaly.  Cardiovascular:     Rate and Rhythm: Normal rate and regular rhythm.     Heart sounds: Normal heart sounds. No murmur heard. Pulmonary:     Effort: No respiratory distress.     Breath sounds: Normal breath sounds. No wheezing or rales.  Abdominal:     General: Bowel sounds are normal. There is no distension.     Palpations: Abdomen is soft. There is no mass.     Tenderness: There is no abdominal tenderness. There is no guarding or rebound.  Musculoskeletal:        General: Normal range of motion.     Cervical back: Normal range of motion and neck supple.     Right lower leg: No edema.     Left lower leg: No edema.  Lymphadenopathy:     Cervical: No cervical adenopathy.  Skin:    Findings: No rash.  Neurological:     Mental Status: She is alert and oriented to person, place, and  time.     Cranial Nerves: No cranial nerve deficit.  Psychiatric:        Behavior: Behavior normal.        Thought Content: Thought content normal.        Judgment: Judgment normal.      No results found for any visits on 05/02/22.    The ASCVD Risk score (Arnett DK, et al., 2019) failed to calculate for the following reasons:   Cannot find a previous HDL lab   Cannot find a previous total cholesterol lab    Assessment & Plan:   Problem List Items Addressed This Visit   None Visit Diagnoses  Physical exam    -  Primary   Relevant Orders   Basic metabolic panel   Lipid panel   CBC with Differential/Platelet   Hepatic function panel   Hep C Antibody     -Recommend flu vaccine, tetanus, and Shingrix.  She declines all today but will consider getting later  -Check labs including hepatitis C antibody  -Read omeprazole 40 mg daily for #90 with 1 refill but recommend consider tapering off for at least a lower dosage within a month or 2  -She has had some tremendous weight gains which she attributes to increased stress past couple years especially with her cancer diagnosis.  She knows she needs to make some changes with regard to diet.  She is requesting something to help "jumpstart "this.  She specifically asked for phentermine.  Her blood pressure is well controlled.  We discussed that this would be short-term use only.  We agreed to short-term trial of phentermine 37.5 mg 1 daily.  Reviewed potential side effects.  Touch base in a couple months regarding follow-up  No follow-ups on file.    Carolann Littler, MD

## 2022-05-03 LAB — HEPATITIS C ANTIBODY: Hepatitis C Ab: NONREACTIVE

## 2022-05-24 ENCOUNTER — Telehealth: Payer: Self-pay | Admitting: Family Medicine

## 2022-05-24 NOTE — Telephone Encounter (Signed)
Patient informed of the message below and verbalized understanding. Patient has received letter via [place]

## 2022-05-24 NOTE — Telephone Encounter (Signed)
Pt called to ask MD to please increase the dosage on the phentermine 37.5 MG capsule    Also, Pt needs a Letter of Medical Necessity so that she can get access to gym, fitness programs, equipment, and/or massage therapy.  Pt is asking that letters be uploaded to [place].  LOV:  05/02/22 = CPE   [id number] [place] [address] Phone:  [phone] Fax:  [phone]

## 2023-07-10 ENCOUNTER — Other Ambulatory Visit: Payer: Self-pay | Admitting: Family Medicine

## 2023-08-02 ENCOUNTER — Other Ambulatory Visit: Payer: Self-pay | Admitting: Family Medicine
